# Patient Record
Sex: Male | Born: 1950 | Hispanic: Yes | Smoking: Never smoker
Health system: Southern US, Community
[De-identification: ages and names within clinical notes are randomized; demographics above are authoritative.]

## PROBLEM LIST (undated history)

## (undated) DIAGNOSIS — I10 Essential (primary) hypertension: Secondary | ICD-10-CM

## (undated) DIAGNOSIS — D649 Anemia, unspecified: Secondary | ICD-10-CM

## (undated) DIAGNOSIS — N400 Enlarged prostate without lower urinary tract symptoms: Secondary | ICD-10-CM

## (undated) HISTORY — DX: Essential (primary) hypertension: I10

## (undated) HISTORY — PX: OTHER SURGICAL HISTORY: SHX169

## (undated) HISTORY — DX: Anemia, unspecified: D64.9

---

## 2011-05-08 ENCOUNTER — Emergency Department (HOSPITAL_COMMUNITY)
Admission: EM | Admit: 2011-05-08 | Discharge: 2011-05-09 | Disposition: A | Discharge status: Home / Self Care | Payer: Self-pay | Attending: Emergency Medicine | Admitting: Emergency Medicine

## 2011-05-08 DIAGNOSIS — R339 Retention of urine, unspecified: Secondary | ICD-10-CM | POA: Insufficient documentation

## 2013-06-26 ENCOUNTER — Ambulatory Visit: Payer: Self-pay

## 2013-06-26 ENCOUNTER — Ambulatory Visit: Payer: Self-pay | Admitting: Family Medicine

## 2013-06-26 VITALS — BP 148/90 | HR 78 | Temp 98.7°F | Resp 16 | Ht 67.5 in | Wt 164.4 lb

## 2013-06-26 DIAGNOSIS — M549 Dorsalgia, unspecified: Secondary | ICD-10-CM

## 2013-06-26 DIAGNOSIS — R319 Hematuria, unspecified: Secondary | ICD-10-CM

## 2013-06-26 DIAGNOSIS — K5900 Constipation, unspecified: Secondary | ICD-10-CM

## 2013-06-26 DIAGNOSIS — R1013 Epigastric pain: Secondary | ICD-10-CM

## 2013-06-26 LAB — POCT URINALYSIS DIPSTICK
Bilirubin, UA: NEGATIVE
Glucose, UA: NEGATIVE
Leukocytes, UA: NEGATIVE
Nitrite, UA: NEGATIVE
Protein, UA: 100
Spec Grav, UA: >=1.03
Urobilinogen, UA: 0.2
pH, UA: 5.5

## 2013-06-26 LAB — POCT CBC
Granulocyte percent: 89.3 %G — AB (ref 37–80)
HCT, POC: 47.5 % (ref 43.5–53.7)
Hemoglobin: 15.3 g/dL (ref 14.1–18.1)
Lymph, poc: 0.9 (ref 0.6–3.4)
MCH, POC: 32.3 pg — AB (ref 27–31.2)
MCHC: 32.2 g/dL (ref 31.8–35.4)
MCV: 100.5 fL — AB (ref 80–97)
MID (cbc): 0.4 (ref 0–0.9)
MPV: 8.5 fL (ref 0–99.8)
POC Granulocyte: 10.4 — AB (ref 2–6.9)
POC LYMPH PERCENT: 7.6 % — AB (ref 10–50)
POC MID %: 3.1 %M (ref 0–12)
Platelet Count, POC: 274 10*3/uL (ref 142–424)
RBC: 4.73 M/uL (ref 4.69–6.13)
RDW, POC: 14.1 %
WBC: 11.7 10*3/uL — AB (ref 4.6–10.2)

## 2013-06-26 LAB — COMPREHENSIVE METABOLIC PANEL
ALT: 36 U/L (ref 0–53)
AST: 21 U/L (ref 0–37)
Albumin: 4.8 g/dL (ref 3.5–5.2)
Calcium: 9.9 mg/dL (ref 8.4–10.5)
Chloride: 103 mEq/L (ref 96–112)
Potassium: 4.1 mEq/L (ref 3.5–5.3)
Total Bilirubin: 0.3 mg/dL (ref 0.3–1.2)
Total Protein: 7.8 g/dL (ref 6.0–8.3)

## 2013-06-26 LAB — POCT UA - MICROSCOPIC ONLY
Bacteria, U Microscopic: NEGATIVE
Casts, Ur, LPF, POC: NEGATIVE
Crystals, Ur, HPF, POC: NEGATIVE
Epithelial cells, urine per micros: NEGATIVE
Mucus, UA: NEGATIVE
RBC, urine, microscopic: NEGATIVE
WBC, Ur, HPF, POC: NEGATIVE
Yeast, UA: NEGATIVE

## 2013-06-26 LAB — COMPREHENSIVE METABOLIC PANEL WITH GFR
Alkaline Phosphatase: 71 U/L (ref 39–117)
BUN: 11 mg/dL (ref 6–23)
CO2: 25 meq/L (ref 19–32)
Creat: 0.72 mg/dL (ref 0.50–1.35)
Glucose, Bld: 160 mg/dL — ABNORMAL HIGH (ref 70–99)
Sodium: 138 meq/L (ref 135–145)

## 2013-06-26 MED ORDER — SENNOSIDES-DOCUSATE SODIUM 8.6-50 MG PO TABS: 2.0000 | ORAL_TABLET | Freq: Every day | ORAL | Status: DC

## 2013-06-26 MED ORDER — PROMETHAZINE HCL 12.5 MG PO TABS: 12.5000 mg | ORAL_TABLET | Freq: Three times a day (TID) | ORAL | Status: DC | PRN

## 2013-06-26 MED ORDER — TAMSULOSIN HCL 0.4 MG PO CAPS: 0.4000 mg | ORAL_CAPSULE | Freq: Every day | ORAL | Status: DC

## 2013-06-26 MED ORDER — NAPROXEN 500 MG PO TABS: 500.0000 mg | ORAL_TABLET | Freq: Two times a day (BID) | ORAL | Status: DC

## 2013-06-26 NOTE — Patient Instructions (Signed)
Dieta B.R.A.T. (B.R.A.T. Diet) Su mdico Reatha Sur ha recomendado la dieta B.R.A.T para usted o su hijo hasta que su enfermedad mejore. Se utiliza comnmente para ayudar a Chief Operating Officer los sntomas de diarrea y vmitos. Si usted o su hijo pueden tolerar el consumo de lquidos claros, tambin pueden consumir:  Bananas.   Arroz.   Compota de [place].   Marvell Fuller (y otros almidones simples como galletas, patatas, y fideos).  Asegrese de [employer] productos lcteos, carnes, y alimentos grasosos hasta que los sntomas mejoren. Los jugos de fruta como el de [place], uvas, o ciruela, pueden [employer]. Evtelos. Contine esta dieta por 2 das o segn las indicaciones del profesional que lo asiste. Document Released: 09/20/2005 Document Revised: 09/09/2011 [facility] Patient Information 2012 [place], [place].Dolor abdominal (Abdominal Pain) El dolor de estmago (abdominal) puede deberse a mltiples causas. No suelen ser peligrosos. La intensidad del dolor no tienen que ver con la gravedad del problema. Muchos de estos casos de dolor abdominal pueden controlarse y tratarse en casa. CUIDADOS EN EL HOGAR  No tome ni administre medicamentos como laxantes, excepto que el mdico se lo indique. Este medicamento hace que una persona evace el intestino.  Use analgsicos y [employer], segn los que Mikylah Ackroyd ha prescripto el mdico.  Coma o beba lo que Timm Bonenberger indique el mdico. El mdico Kirrah Mustin dir si debe seguir una dieta especial. BUSQUE AYUDA DE INMEDIATO SI:  El dolor persiste.  Tiene fiebre.  Usted sigue vomitando.  El dolor cambia y se localiza slo en la zona derecha o izquierda del abdomen.  La materia fecal es sangre o parece alquitrn. ASEGRESE DE QUE:   Comprende esas instrucciones para el alta mdica.  Controlar su enfermedad.  Pedir ayuda de inmediato si no mejora o empeora. Document Released: 12/17/2008 Document Revised: 12/13/2011 [facility] Patient Information 2014 [place],  [place].

## 2013-06-26 NOTE — Progress Notes (Signed)
[facility]:  Office Visit  Chief Complaint:  Chief Complaint  Patient presents with  . Nausea    Started last night  . Emesis  . Abdominal Pain    HPI: Thomas Beard is a  62 y.o. Hispanic  male who complains of ate last night around 9-10 pm, then had abd pain at 3 am, has had nausea, but no vomiting Ate fried chicken last night,  Feels like he has a lot of gas in in stomach , Moderate crampy pain, no one else is sick with similar sxs. Has not had similar sxs like this before. No fevers, chills, minimal pain with movement.  He still has appendix and also GB. No prior abd surgeires.  He thinks he has reflux.  He had BM yesterday morning, normal BM. No straining, no constipation No blood in urine or stool.  He denies having a colonoscopy. Denies kidney stones   History reviewed. No pertinent past medical history. History reviewed. No pertinent past surgical history. History   Social History  . Marital Status: Unknown    Spouse Name: N/A    Number of Children: N/A  . Years of Education: N/A   Social History Main Topics  . Smoking status: Never Smoker   . Smokeless tobacco: None  . Alcohol Use: No  . Drug Use: No  . Sexual Activity: None   Other Topics Concern  . None   Social History Narrative  . None   History reviewed. No pertinent family history. No Known Allergies Prior to Admission medications   Not on File     ROS: The patient denies fevers, chills, night sweats, unintentional weight loss, chest pain, palpitations, wheezing, dyspnea on exertion,  dysuria, hematuria, melena, numbness, weakness, or tingling. + nausea, no vomiting  All other systems have been reviewed and were otherwise negative with the exception of those mentioned in the HPI and as above.    PHYSICAL EXAM: Filed Vitals:   06/26/13 0813  BP: 148/90  Pulse: 78  Temp: 98.7 F (37.1 C)  Resp: 16   Filed Vitals:   06/26/13 0813  Height: 5' 7.5" (1.715 m)  Weight:  164 lb 6.4 oz (74.571 kg)   Body mass index is 25.35 kg/(m^2).  General: Alert, minimal distress HEENT:  Normocephalic, atraumatic, oropharynx patent. EOMI, PERRLA Cardiovascular:  Regular rate and rhythm, no rubs murmurs or gallops.  No Carotid bruits, radial pulse intact. No pedal edema.  Respiratory: Clear to auscultation bilaterally.  No wheezes, rales, or rhonchi.  No cyanosis, no use of accessory musculature GI: No organomegaly, abdomen is soft and bilateral upper midepigastric tenderness, positive bowel sounds.   No guarding. No masses. + CVA tenderness  Skin: No rashes. Neurologic: Facial musculature symmetric. Psychiatric: Patient is appropriate throughout our interaction. Lymphatic: No cervical lymphadenopathy Musculoskeletal: Gait intact.   LABS: Results for orders placed in visit on 06/26/13  POCT CBC      Result Value Range   WBC 11.7 (*) 4.6 - 10.2 K/uL   Lymph, poc 0.9  0.6 - 3.4   POC LYMPH PERCENT 7.6 (*) 10 - 50 %L   MID (cbc) 0.4  0 - 0.9   POC MID % 3.1  0 - 12 %M   POC Granulocyte 10.4 (*) 2 - 6.9   Granulocyte percent 89.3 (*) 37 - 80 %G   RBC 4.73  4.69 - 6.13 M/uL   Hemoglobin 15.3  14.1 - 18.1 g/dL   HCT, POC 47.5  43.5 - 53.7 %   MCV 100.5 (*) 80 - 97 fL   MCH, POC 32.3 (*) 27 - 31.2 pg   MCHC 32.2  31.8 - 35.4 g/dL   RDW, POC 47.8     Platelet Count, POC 274  142 - 424 K/uL   MPV 8.5  0 - 99.8 fL  POCT UA - MICROSCOPIC ONLY      Result Value Range   WBC, Ur, HPF, POC neg     RBC, urine, microscopic neg     Bacteria, U Microscopic neg     Mucus, UA neg     Epithelial cells, urine per micros neg     Crystals, Ur, HPF, POC neg     Casts, Ur, LPF, POC neg     Yeast, UA neg    POCT URINALYSIS DIPSTICK      Result Value Range   Color, UA yellow     Clarity, UA clear     Glucose, UA neg     Bilirubin, UA neg     Ketones, UA trace     Spec Grav, UA >=1.030     Blood, UA trace     pH, UA 5.5     Protein, UA 100     Urobilinogen, UA 0.2      Nitrite, UA neg     Leukocytes, UA Negative       EKG/XRAY:   Primary read interpreted by Dr. Conley Rolls at [facility]. Abnormal gas pattern with area of narrowing, please comment No free air Please comment if you see any xray e/o hydro or stones   ASSESSMENT/PLAN: Encounter Diagnoses  Name Primary?  . Abdominal pain, epigastric Yes  . Back pain   . Constipation   . Hematuria    ? Etiology: Biliary vs Renal Stones vs Appendicitis vs obstruction.  Mild leukocytosis and also hematuria and also abnormal gas pattern on abd xray may just be related to constipaton with early signs of obstruction Rx Naproxen, Phenergan, Flomax and strainer. No narcotics at this time.  Push liquids, no foods for now except maybe bland diet gave samples of miralax and also  Advise to try otc senokot S Gave patient precautions to go to Er for worsening sxs,  reiterated patient precautions to go to ER vis translator They deferred RUQ abdominal US at this time.  F/u prn Gross sideeffects, risk and benefits, and alternatives of medications d/w patient. Patient is aware that all medications have potential sideeffects and we are unable to predict every sideeffect or drug-drug interaction that may occur.  Romario Tith PHUONG, DO 06/26/2013 10:09 AM

## 2013-06-28 ENCOUNTER — Telehealth: Payer: Self-pay | Admitting: Family Medicine

## 2013-06-28 NOTE — Telephone Encounter (Signed)
Spoke to patient about xrays and also lab results, he is doing much better. Advise to go to ER prn

## 2014-05-06 DIAGNOSIS — R339 Retention of urine, unspecified: Secondary | ICD-10-CM | POA: Insufficient documentation

## 2014-05-07 ENCOUNTER — Encounter (HOSPITAL_COMMUNITY): Payer: Self-pay | Admitting: Emergency Medicine

## 2014-05-07 ENCOUNTER — Emergency Department (HOSPITAL_COMMUNITY)
Admission: EM | Admit: 2014-05-07 | Discharge: 2014-05-07 | Disposition: A | Discharge status: Home / Self Care | Payer: Self-pay | Attending: Emergency Medicine | Admitting: Emergency Medicine

## 2014-05-07 DIAGNOSIS — R339 Retention of urine, unspecified: Secondary | ICD-10-CM

## 2014-05-07 LAB — URINALYSIS, ROUTINE W REFLEX MICROSCOPIC
BILIRUBIN URINE: NEGATIVE
GLUCOSE, UA: NEGATIVE mg/dL
KETONES UR: NEGATIVE mg/dL
Leukocytes, UA: NEGATIVE
Nitrite: NEGATIVE
PROTEIN: 30 mg/dL — AB
Specific Gravity, Urine: 1.009 (ref 1.005–1.030)
Urobilinogen, UA: 0.2 mg/dL (ref 0.0–1.0)
pH: 5.5 (ref 5.0–8.0)

## 2014-05-07 LAB — URINE MICROSCOPIC-ADD ON

## 2014-05-07 MED ORDER — TAMSULOSIN HCL 0.4 MG PO CAPS: 0.4000 mg | ORAL_CAPSULE | Freq: Every day | ORAL | Status: DC

## 2014-05-07 NOTE — Discharge Instructions (Signed)
Retención urinaria aguda °(Acute Urinary Retention) °La retención urinaria aguda es la incapacidad transitoria para orinar. °Es un problema muy frecuente en los hombre de edad avanzada. A medida que el hombre envejece, la próstata se agranda y bloquea el flujo de orina que proviene de la vejiga. Generalmente es un problema que surge gradualmente.  °INSTRUCCIONES PARA EL CUIDADO EN EL HOGAR °Si van a enviarlo a su casa con un catéter Foley y un sistema de drenaje, necesitará resolver cuál será el mejor curso de acción junto con su médico. Mientras el catéter esté colocado, mantenga una buena ingesta de líquidos. Mantenga la bolsa de drenaje vacía y en posición más baja que el catéter. De este modo, la orina contaminada no fluirá hacia atrás, hacia la vejiga, lo que podría causar una infección urinaria. °Hay dos tipos principales de bolsa de drenaje. Una es una bolsa grande que generalmente se utiliza por la noche. Tiene una buena capacidad, lo que permitirá dormir toda la noche sin tener que vaciarla. El segundo tipo se llama bolsa de pierna. Tiene menos capacidad por lo tanto necesita vaciarse con más frecuencia. Sin embargo, la ventaja principal es que puede adherirse con una correa a la pierna y puede ir debajo de la ropa, permitiendo la libertad para moverse o dejar su casa. °Utilice los medicamentos de venta libre o recetados para calmar el dolor, el malestar o la fiebre, según se lo indique el médico.  °SOLICITE ATENCIÓN MÉDICA SI: °· Tiene fiebre baja. °· Siente espasmos o pérdidas de orina con los espasmos. °SOLICITE ATENCIÓN MÉDICA DE INMEDIATO SI:  °· Siente escalofríos o le sube la fiebre. °· El catéter deja de drenar orina. °· El catéter se sale del lugar. °· Aumenta el sangrado y no cesa con reposo ni al aumentar la ingesta de líquidos. °ASEGÚRESE DE QUE: °· Comprende estas instrucciones. °· Controlará su afección. °· Recibirá ayuda de inmediato si no mejora o si empeora. °Document Released: 06/30/2005  Document Revised: 09/25/2013 °ExitCare® Patient Information ©2015 [employer]. This information is not intended to replace advice given to you by your health care provider. Make sure you discuss any questions you have with your health care provider. ° °

## 2014-05-07 NOTE — ED Notes (Signed)
Pt. reports urinary retention / bladder pressure onset this evening .

## 2014-05-07 NOTE — ED Provider Notes (Signed)
CSN: [id number]     Arrival date & time 05/06/14  2353 History   First MD Initiated Contact with Patient 05/07/14 0020     Chief Complaint  Patient presents with  . Urinary Retention     (Consider location/radiation/quality/duration/timing/severity/associated sxs/prior Treatment) HPI Patient presents with acute urinary retention starting this evening. Describes bladder pressure and inability to urinate. No fever or chills. No blood in his urine. No flank pain. Patient has had previous episodes similar to this 2 years ago. Was diagnosed with urinary retention due to BPH. Start on Flomax at that time. Currently not taking Flomax. For catheter placed in triage with improvement of patient's bladder pressure. He is currently asymptomatic. History reviewed. No pertinent past medical history. History reviewed. No pertinent past surgical history. No family history on file. History  Substance Use Topics  . Smoking status: Never Smoker   . Smokeless tobacco: Not on file  . Alcohol Use: No    Review of Systems  Constitutional: Negative for fever and chills.  Eyes: Negative for visual disturbance.  Respiratory: Negative for cough, chest tightness, shortness of breath and wheezing.   Cardiovascular: Negative for chest pain and palpitations.  Gastrointestinal: Negative for nausea, vomiting and abdominal pain.  Genitourinary: Positive for difficulty urinating. Negative for frequency, hematuria and flank pain.  Musculoskeletal: Negative for back pain, neck pain and neck stiffness.  Skin: Negative for rash and wound.  Neurological: Negative for dizziness, weakness, light-headedness, numbness and headaches.  All other systems reviewed and are negative.     Allergies  Review of patient's allergies indicates no known allergies.  Home Medications   Prior to Admission medications   Not on File   BP 157/101  Pulse 85  Temp(Src) 97.3 F (36.3 C) (Oral)  Resp 14  Wt 166 lb (75.297 kg)  SpO2  97% Physical Exam  Nursing note and vitals reviewed. Constitutional: He is oriented to person, place, and time. He appears well-developed and well-nourished. No distress.  HENT:  Head: Normocephalic and atraumatic.  Mouth/Throat: Oropharynx is clear and moist.  Eyes: EOM are normal. Pupils are equal, round, and reactive to light.  Neck: Normal range of motion. Neck supple.  Cardiovascular: Normal rate and regular rhythm.   Pulmonary/Chest: Effort normal and breath sounds normal. No respiratory distress. He has no wheezes. He has no rales.  Abdominal: Soft. Bowel sounds are normal. He exhibits no distension and no mass. There is no tenderness. There is no rebound and no guarding.  Musculoskeletal: Normal range of motion. He exhibits no edema and no tenderness.  No CVA tenderness  Neurological: He is alert and oriented to person, place, and time.  Skin: Skin is warm and dry. No rash noted. No erythema.  Psychiatric: He has a normal mood and affect. His behavior is normal.    ED Course  Procedures (including critical care time) Labs Review Labs Reviewed  URINALYSIS, ROUTINE W REFLEX MICROSCOPIC - Abnormal; Notable for the following:    APPearance CLOUDY (*)    Hgb urine dipstick LARGE (*)    Protein, ur 30 (*)    All other components within normal limits  URINE MICROSCOPIC-ADD ON - Abnormal; Notable for the following:    Bacteria, UA FEW (*)    All other components within normal limits    Imaging Review No results found.   EKG Interpretation None      MDM   Final diagnoses:  None    Patient urinary retention resolved refill catheter. He is advised  to followup with his urologist. Maryclare LabradorWe'll start back on Flomax. Return precautions given.    Loren Raceravid Reilley Valentine, MD 05/07/14 [phone]

## 2014-05-07 NOTE — ED Notes (Signed)
Drainage bag changed on pt.  Leg bag placed.

## 2014-05-24 ENCOUNTER — Encounter (HOSPITAL_COMMUNITY): Payer: Self-pay | Admitting: Emergency Medicine

## 2014-05-24 ENCOUNTER — Emergency Department (HOSPITAL_COMMUNITY)
Admission: EM | Admit: 2014-05-24 | Discharge: 2014-05-24 | Disposition: A | Discharge status: Home / Self Care | Payer: Self-pay | Attending: Emergency Medicine | Admitting: Emergency Medicine

## 2014-05-24 DIAGNOSIS — R319 Hematuria, unspecified: Secondary | ICD-10-CM | POA: Insufficient documentation

## 2014-05-24 DIAGNOSIS — N3000 Acute cystitis without hematuria: Secondary | ICD-10-CM | POA: Insufficient documentation

## 2014-05-24 DIAGNOSIS — N390 Urinary tract infection, site not specified: Secondary | ICD-10-CM

## 2014-05-24 DIAGNOSIS — N3001 Acute cystitis with hematuria: Secondary | ICD-10-CM

## 2014-05-24 DIAGNOSIS — R509 Fever, unspecified: Secondary | ICD-10-CM

## 2014-05-24 DIAGNOSIS — Z79899 Other long term (current) drug therapy: Secondary | ICD-10-CM | POA: Insufficient documentation

## 2014-05-24 LAB — BASIC METABOLIC PANEL
Anion gap: 18 — ABNORMAL HIGH (ref 5–15)
BUN: 10 mg/dL (ref 6–23)
CHLORIDE: 99 meq/L (ref 96–112)
CO2: 19 meq/L (ref 19–32)
Calcium: 8.9 mg/dL (ref 8.4–10.5)
Creatinine, Ser: 0.76 mg/dL (ref 0.50–1.35)
GFR calc Af Amer: >90 mL/min (ref >90)
GFR calc non Af Amer: >90 mL/min (ref >90)
GLUCOSE: 166 mg/dL — AB (ref 70–99)
POTASSIUM: 3.6 meq/L — AB (ref 3.7–5.3)
SODIUM: 136 meq/L — AB (ref 137–147)

## 2014-05-24 LAB — I-STAT CHEM 8, ED
BUN: 9 mg/dL (ref 6–23)
Calcium, Ion: 1.01 mmol/L — ABNORMAL LOW (ref 1.13–1.30)
Chloride: 105 mEq/L (ref 96–112)
Creatinine, Ser: 0.7 mg/dL (ref 0.50–1.35)
Glucose, Bld: 181 mg/dL — ABNORMAL HIGH (ref 70–99)
HCT: 42 % (ref 39.0–52.0)
Hemoglobin: 14.3 g/dL (ref 13.0–17.0)
Potassium: 3.4 mEq/L — ABNORMAL LOW (ref 3.7–5.3)
Sodium: 134 mEq/L — ABNORMAL LOW (ref 137–147)
TCO2: 19 mmol/L (ref 0–100)

## 2014-05-24 LAB — CBC WITH DIFFERENTIAL/PLATELET
Basophils Absolute: 0 10*3/uL (ref 0.0–0.1)
Basophils Relative: 0 % (ref 0–1)
EOS PCT: 0 % (ref 0–5)
Eosinophils Absolute: 0 10*3/uL (ref 0.0–0.7)
HCT: 39 % (ref 39.0–52.0)
Hemoglobin: 13.5 g/dL (ref 13.0–17.0)
LYMPHS ABS: 0.3 10*3/uL — AB (ref 0.7–4.0)
LYMPHS PCT: 4 % — AB (ref 12–46)
MCH: 32 pg (ref 26.0–34.0)
MCHC: 34.6 g/dL (ref 30.0–36.0)
MCV: 92.4 fL (ref 78.0–100.0)
Monocytes Absolute: 0.6 10*3/uL (ref 0.1–1.0)
Monocytes Relative: 7 % (ref 3–12)
NEUTROS PCT: 89 % — AB (ref 43–77)
Neutro Abs: 6.9 10*3/uL (ref 1.7–7.7)
PLATELETS: 260 10*3/uL (ref 150–400)
RBC: 4.22 MIL/uL (ref 4.22–5.81)
RDW: 14.2 % (ref 11.5–15.5)
WBC: 7.7 10*3/uL (ref 4.0–10.5)

## 2014-05-24 LAB — URINALYSIS, ROUTINE W REFLEX MICROSCOPIC
Glucose, UA: NEGATIVE mg/dL
Ketones, ur: 40 mg/dL — AB
Nitrite: POSITIVE — AB
PH: 6 (ref 5.0–8.0)
Specific Gravity, Urine: 1.03 (ref 1.005–1.030)
Urobilinogen, UA: 1 mg/dL (ref 0.0–1.0)

## 2014-05-24 LAB — URINE MICROSCOPIC-ADD ON

## 2014-05-24 MED ORDER — DEXTROSE 5 % IV SOLN
1.0000 g | Freq: Once | INTRAVENOUS | Status: AC
  Administered 2014-05-24: 1 g via INTRAVENOUS

## 2014-05-24 MED ORDER — SODIUM CHLORIDE 0.9 % IV BOLUS (SEPSIS)
1000.0000 mL | Freq: Once | INTRAVENOUS | Status: AC
  Administered 2014-05-24: 1000 mL via INTRAVENOUS

## 2014-05-24 MED ORDER — CEPHALEXIN 500 MG PO CAPS: 500.0000 mg | ORAL_CAPSULE | Freq: Three times a day (TID) | ORAL | Status: DC

## 2014-05-24 MED ORDER — ACETAMINOPHEN 325 MG PO TABS
650.0000 mg | ORAL_TABLET | Freq: Four times a day (QID) | ORAL | Status: DC | PRN
  Administered 2014-05-24: 650 mg via ORAL
  Filled 2014-05-24: qty 2

## 2014-05-24 MED ORDER — CIPROFLOXACIN HCL 500 MG PO TABS: 500.0000 mg | ORAL_TABLET | Freq: Two times a day (BID) | ORAL | Status: DC

## 2014-05-24 MED ORDER — DEXTROSE 5 % IV SOLN
1.0000 g | Freq: Once | INTRAVENOUS | Status: DC
  Filled 2014-05-24: qty 10

## 2014-05-24 NOTE — ED Notes (Signed)
Pt brought to [facility] 11 per triage nurse. Bladder scan done.

## 2014-05-24 NOTE — ED Notes (Signed)
Pt reports catheter position is better since reposition; no blood noted around catheter at this time

## 2014-05-24 NOTE — ED Provider Notes (Signed)
CSN: [id number]     Arrival date & time 05/24/14  1310 History  This chart was scribed for non-physician practitioner Arthor CaptainAbigail Rozalyn Osland, PA-C working with Samuel JesterKathleen McManus, DO by Leone PayorSonum Patel, ED Scribe. This patient was seen in room TR11C/TR11C and the patient's care was started at 1:40 PM.    Chief Complaint  Patient presents with  . Hematuria    The history is provided by the patient. No language interpreter was used.    HPI Comments: Thomas Beard is a 63 y.o. male who presents to the Emergency Department complaining of urinary retention with associated suprapubic pain that began this morning. Patient states his last urine void was about 5.5 hours ago. Patient was initially seen on 05/06/14 for urinary retention, which was relieved after he was catheterized. Patient was then seen by urologist yesterday when the catheter was removed. He was able to urinate normally yesterday as well as early this morning. Patient reports associated hematuria that began about 3 days ago. He reports associated episodes of intermittent chills and fever for the same period of time. He denies nausea, vomiting.    History reviewed. No pertinent past medical history. History reviewed. No pertinent past surgical history. History reviewed. No pertinent family history. History  Substance Use Topics  . Smoking status: Never Smoker   . Smokeless tobacco: Not on file  . Alcohol Use: No    Review of Systems  Constitutional: Positive for fever and chills.  Gastrointestinal: Negative for nausea and vomiting.  Genitourinary: Positive for hematuria and difficulty urinating.       Urinary retention   All other systems reviewed and are negative.     Allergies  Review of patient's allergies indicates no known allergies.  Home Medications   Prior to Admission medications   Medication Sig Start Date End Date Taking? Authorizing Provider  tamsulosin (FLOMAX) 0.4 MG CAPS capsule Take 1 capsule (0.4 mg total) by mouth  daily. 05/07/14   Loren Raceravid Yelverton, MD   BP 126/79  Pulse 145  Temp(Src) 98.6 F (37 C) (Oral)  Resp 24  Ht 5\' 7"  (1.702 m)  Wt 160 lb (72.576 kg)  BMI 25.05 kg/m2  SpO2 95% Physical Exam  Nursing note and vitals reviewed. Constitutional: He is oriented to person, place, and time. He appears well-developed and well-nourished.  Uncomfortable appearing   HENT:  Head: Normocephalic and atraumatic.  Cardiovascular: Normal rate.   Pulmonary/Chest: Effort normal.  Abdominal: He exhibits no distension.  Genitourinary: Circumcised. Discharge (blood urethral meatus ) found.  Neurological: He is alert and oriented to person, place, and time.  Skin: Skin is warm and dry.  Psychiatric: He has a normal mood and affect.    ED Course  BLADDER CATHETERIZATION Date/Time: 05/28/2014 12:10 PM Performed by: Arthor CaptainHARRIS, Jameal Razzano Authorized by: Arthor CaptainHARRIS, Nellie Pester Consent: Verbal consent obtained. Risks and benefits: risks, benefits and alternatives were discussed Consent given by: patient Patient identity confirmed: verbally with patient Time out: Immediately prior to procedure a "time out" was called to verify the correct patient, procedure, equipment, support staff and site/side marked as required. Indications: hematuria and urinary retention Local anesthesia used: yes Local anesthetic: topical anesthetic Anesthetic total: 2 ml Patient sedated: no Preparation: Patient was prepped and draped in the usual sterile fashion. Catheter insertion: temporary indwelling Catheter type: Foley Catheter size: 18 Fr Complicated insertion: no Altered anatomy: no Bladder irrigation: no Number of attempts: 1 Urine volume: 900 ml Urine characteristics: bloody Patient tolerance: Patient tolerated the procedure well with no immediate complications.   (  including critical care time)  DIAGNOSTIC STUDIES: Oxygen Saturation is 95% on RA, adequate by my interpretation.    COORDINATION OF CARE: 1:47 PM Will  catheterize patient. Discussed treatment plan with pt at bedside and pt agreed to plan.   Labs Review Labs Reviewed  URINALYSIS, ROUTINE W REFLEX MICROSCOPIC - Abnormal; Notable for the following:    Color, Urine RED (*)    APPearance TURBID (*)    Hgb urine dipstick LARGE (*)    Bilirubin Urine LARGE (*)    Ketones, ur 40 (*)    Protein, ur >300 (*)    Nitrite POSITIVE (*)    Leukocytes, UA MODERATE (*)    All other components within normal limits  CBC WITH DIFFERENTIAL - Abnormal; Notable for the following:    Neutrophils Relative % 89 (*)    Lymphocytes Relative 4 (*)    Lymphs Abs 0.3 (*)    All other components within normal limits  BASIC METABOLIC PANEL - Abnormal; Notable for the following:    Sodium 136 (*)    Potassium 3.6 (*)    Glucose, Bld 166 (*)    Anion gap 18 (*)    All other components within normal limits  I-STAT CHEM 8, ED - Abnormal; Notable for the following:    Sodium 134 (*)    Potassium 3.4 (*)    Glucose, Bld 181 (*)    Calcium, Ion 1.01 (*)    All other components within normal limits  URINE MICROSCOPIC-ADD ON    Imaging Review No results found.   EKG Interpretation None      MDM   Final diagnoses:  Acute hemorrhagic cystitis  UTI (lower urinary tract infection)  Fever and chills    Patient with acute urinary retention. Insertion of a new Foley catheter achieved with greater than 900 mL of grossly bloody urine. Patient felt significant relief after retention relieved. Awaiting urinalysis.   Patient given a fluid bolus, Tylenol for new onset fever during his visit. UA does appear to be positive for UTI. No white blood cell count elevation no elevation in creatinine. Patient's receiving 1 g Rocephin IV. The please consult to on call urologist.   I spoke with Dr. Annabell Howells was on call for [facility]. The patient's finger has resolved and his tachycardia is decreased after treatment of his fever. Do not feel the patient has sepsis or urosepsis.  He has no CVA angle tenderness. Patient will be discharged with Cipro per recommendation of Dr. Annabell Howells Patient / Family / Caregiver informed of clinical course, understand medical decision-making process, and agree with plan. I've obtained a followup appointment for the patient.  I personally reviewed the imaging tests through PACS system. I have reviewed and interpreted Lab values. I reviewed available ER/hospitalization records through the EMR    I personally performed the services described in this documentation, which was scribed in my presence. The recorded information has been reviewed and is accurate.    Arthor Captain, PA-C 05/28/14 1221

## 2014-05-24 NOTE — ED Notes (Signed)
Per pt family pt had catheter taken out yesterday and unable to void this am and only blood. Pt tachy at triage HR 145. Pt in severe pain

## 2014-05-24 NOTE — Discharge Instructions (Signed)
Infeccin urinaria  (Urinary Tract Infection)  La infeccin urinaria puede ocurrir en cualquier lugar del tracto urinario. El tracto urinario es un sistema de drenaje del cuerpo por el que se eliminan los desechos y el exceso de agua. El tracto urinario est formado por dos riones, dos urteres, la vejiga y la uretra. Los riones son rganos que tienen forma de frijol. Cada rin tiene aproximadamente el tamao del puo. Estn situados debajo de las costillas, uno a cada lado de la columna vertebral CAUSAS  La causa de la infeccin son los microbios, que son organismos microscpicos, que incluyen hongos, virus, y bacterias. Estos organismos son tan pequeos que slo pueden verse a travs del microscopio. Las bacterias son los microorganismos que ms comnmente causan infecciones urinarias.  SNTOMAS  Los sntomas pueden variar segn la edad y el sexo del paciente y por la ubicacin de la infeccin. Los sntomas en las mujeres jvenes incluyen la necesidad frecuente e intensa de orinar y una sensacin dolorosa de ardor en la vejiga o en la uretra durante la miccin. Las mujeres y los hombres mayores podrn sentir cansancio, temblores y debilidad y sentir dolores musculares y dolor abdominal. Si tiene fiebre, puede significar que la infeccin est en los riones. Otros sntomas son dolor en la espalda o en los lados debajo de las costillas, nuseas y vmitos.  DIAGNSTICO  Para diagnosticar una infeccin urinaria, el mdico le preguntar acerca de sus sntomas. Tambin le solicitar una muestra de orina. La muestra de orina se analiza para detectar bacterias y glbulos blancos de la sangre. Los glbulos blancos se forman en el organismo para ayudar a combatir las infecciones.  TRATAMIENTO  Por lo general, las infecciones urinarias pueden tratarse con medicamentos. Debido a que la mayora de las infecciones son causadas por bacterias, por lo general pueden tratarse con antibiticos. La eleccin del  antibitico y la duracin del tratamiento depender de sus sntomas y el tipo de bacteria causante de la infeccin.  INSTRUCCIONES PARA EL CUIDADO EN EL HOGAR   Si le recetaron antibiticos, tmelos exactamente como su mdico le indique. Termine el medicamento aunque se sienta mejor despus de haber tomado slo algunos.  Beba gran cantidad de lquido para mantener la orina de tono claro o color amarillo plido.  Evite la cafena, el t y las bebidas gaseosas. Estas sustancias irritan la vejiga.  Vaciar la vejiga con frecuencia. Evite retener la orina durante largos perodos.  Vace la vejiga antes y despus de tener relaciones sexuales.  Despus de mover el intestino, las mujeres deben higienizarse la regin perineal desde adelante hacia atrs. Use slo un papel tissue por vez. SOLICITE ATENCIN MDICA SI:   Siente dolor en la espalda.  Le sube la fiebre.  Los sntomas no mejoran luego de 3 das. SOLICITE ATENCIN MDICA DE INMEDIATO SI:   Siente dolor intenso en la espalda o en la zona inferior del abdomen.  Comienza a sentir escalofros.  Tiene nuseas o vmitos.  Tiene una sensacin continua de quemazn o molestias al orinar. ASEGRESE DE QUE:   Comprende estas instrucciones.  Controlar su enfermedad.  Solicitar ayuda de inmediato si no mejora o empeora. Document Released: 06/30/2005 Document Revised: 06/14/2012 ExitCare Patient Information 2015 ExitCare, LLC. This information is not intended to replace advice given to you by your health care provider. Make sure you discuss any questions you have with your health care provider.   Ciprofloxacin tablets Qu es este medicamento? La CIPROFLOXACINA es un antibitico quinolnico. Se utiliza en el   tratamiento de ciertos tipos de infecciones bacterianas. No es efectivo para resfros, gripe u otras infecciones de origen viral. Este medicamento puede ser utilizado para otros usos; si tiene alguna pregunta consulte con su  proveedor de atencin mdica o con su farmacutico. MARCAS COMERCIALES DISPONIBLES: Cipro Qu le debo informar a mi profesional de la salud antes de tomar este medicamento? Necesita saber si usted presenta alguno de los siguientes problemas o situaciones: -problemas de huesos -enfermedad cerebral -problemas de articulaciones -pulso cardaco irregular -enfermedad renal -enfermedad heptica -miastenia gravis -trastorno de convulsiones -problemas de tendones -una reaccin alrgica o inusual a la ciprofloxacina, a otros antibiticos, medicamentos, alimentos, colorantes o conservantes -si est embarazada o buscando quedar embarazada -si est amamantando a un beb Cmo debo utilizar este medicamento? Tome este medicamento por va oral con un vaso de agua. Siga las instrucciones de la etiqueta del medicamento. Tome sus dosis a intervalos regulares. No tome su medicamento con una frecuencia mayor a la indicada. Tome todas las dosis de su medicamento como se le haya indicado aun si se siente mejor. No omita ninguna dosis o suspenda el uso de su medicamento antes de lo indicado. Este medicamento se puede tomar con las comidas o con el estmago vaco. No lo debe tomar con productos lcteos, tales como leche, yogur o jugos fortificados con calcio solos, sin embargo lo puede tomar con comidas que contienen productos lcteos. Su farmacutico le dar una Gua del medicamento especial con cada receta y relleno. Asegrese de leer esta informacin cada vez cuidadosamente. Hable con su pediatra para informarse acerca del uso de este medicamento en nios. Puede requerir atencin especial. Sobredosis: Pngase en contacto inmediatamente con un centro toxicolgico o una sala de urgencia si usted cree que haya tomado demasiado medicamento. ATENCIN: Este medicamento es solo para usted. No comparta este medicamento con nadie. Qu sucede si me olvido de una dosis? Si olvida una dosis, tmela lo antes posible. Si es  casi la hora de la prxima dosis, tome slo esa dosis. No tome dosis adicionales o dobles. Qu puede interactuar con este medicamento? No tome esta medicina con ninguno de los siguientes medicamentos: -cisapride -droperidol -terfenadina -tizanidina Esta medicina tambin puede interactuar con los siguientes medicamentos -anticidos -pldoras anticonceptivas -cafena -ciclosporina -polvo o tabletas tamponadas de didanosina (ddI) -medicamentos para la diabetes -medicamentos para la inflamacin, tales como ibuprofeno, naproxeno -metotrexato -multivitaminas -omeprazol -fenitona -probenecid -sucralfato -teofilina -warfarina Puede ser que esta lista no menciona todas las posibles interacciones. Informe a su profesional de la salud de todos los productos a base de hierbas, medicamentos de venta libre o suplementos nutritivos que est tomando. Si usted fuma, consume bebidas alcohlicas o si utiliza drogas ilegales, indqueselo tambin a su profesional de la salud. Algunas sustancias pueden interactuar con su medicamento. A qu debo estar atento al usar este medicamento? Si los sntomas no mejoran, consulte con su mdico o con su profesional de la salud. No trate la diarrea con productos de venta libre. Comunquese con su mdico si tiene diarrea que dura ms de 2 das o si es severa y acuosa. Puede experimentar somnolencia o mareos. No conduzca ni utilice maquinaria, ni haga nada que le exija permanecer en estado de alerta hasta que sepa cmo le afecta este medicamento. No se siente ni se ponga de pie con rapidez, especialmente si es un paciente de edad avanzada. Esto reduce el riesgo de mareos o desmayos. Este medicamento puede aumentar la sensibilidad al sol. Mantngase fuera de la luz solar. Si no lo puede   evitar, utilice ropa protectora y crema de proteccin solar. No utilice lmparas solares, camas solares ni cabinas solares. Evite las preparaciones de anticidos, aluminio, calcio, hierro,  magnesio o cinc por lo menos 6 horas antes o 2 horas despus de tomar este medicamento. Qu efectos secundarios puedo tener al utilizar este medicamento? Efectos secundarios que debe informar a su mdico o a su profesional de la salud tan pronto como sea posible: -reacciones alrgicas como erupcin cutnea, picazn o urticarias, hinchazn de la cara, labios o lengua -problemas respiratorios -confusin, pesadillas o alucinaciones -sensacin de desmayos o aturdimiento, cadas -pulso cardiaco irregular -hinchazn o dolores musculares, articulares o de tendones -dolor o dificultad para orinar -dolor de cabeza persistente con o sin visin borrosa -enrojecimiento, formacin de ampollas, descamacin o aflojamiento de la piel, inclusive dentro de la boca -convulsiones -dolor, entumecimiento, hormigueo o debilidad inusual Efectos secundarios que, por lo general, no requieren atencin mdica (debe informarlos a su mdico o a su profesional de la salud si persisten o si son molestos): -diarrea -nuseas o malestar estomacal -manchas blancas o llagas dentro de la boca Puede ser que esta lista no menciona todos los posibles efectos secundarios. Comunquese a su mdico por asesoramiento mdico sobre los efectos secundarios. Usted puede informar los efectos secundarios a la FDA por telfono al 1-800-FDA-1088. Dnde debo guardar mi medicina? Mantngala fuera del alcance de los nios. Gurdela a temperatura ambiente, a menos de 30 grados C (86 grados F). Mantenga el envase bien cerrado. Deseche todo el medicamento que no haya utilizado, despus de la fecha de vencimiento. ATENCIN: Este folleto es un resumen. Puede ser que no cubra toda la posible informacin. Si usted tiene preguntas acerca de esta medicina, consulte con su mdico, su farmacutico o su profesional de la salud.  2015, Elsevier[employer]. (2013-07-23 16:49:31)  

## 2014-05-24 NOTE — ED Notes (Addendum)
Pt urinating dark red urine around catheter. Reports "every time I have to go, it comes out around the catheter." Catheter repositioned and taped. Linens changed by NT. Pt denies pain

## 2014-05-28 NOTE — ED Provider Notes (Signed)
Medical screening examination/treatment/procedure(s) were performed by non-physician practitioner and as supervising physician I was immediately available for consultation/collaboration.   EKG Interpretation None        Maria Gallicchio, DO 05/28/14 1741 

## 2014-07-16 ENCOUNTER — Emergency Department (HOSPITAL_COMMUNITY)
Admission: EM | Admit: 2014-07-16 | Discharge: 2014-07-16 | Disposition: A | Discharge status: Home / Self Care | Payer: Worker's Compensation | Attending: Emergency Medicine | Admitting: Emergency Medicine

## 2014-07-16 ENCOUNTER — Emergency Department (HOSPITAL_COMMUNITY): Payer: Worker's Compensation

## 2014-07-16 ENCOUNTER — Encounter (HOSPITAL_COMMUNITY): Payer: Self-pay | Admitting: Emergency Medicine

## 2014-07-16 DIAGNOSIS — Y288XXA Contact with other sharp object, undetermined intent, initial encounter: Secondary | ICD-10-CM | POA: Insufficient documentation

## 2014-07-16 DIAGNOSIS — Z79899 Other long term (current) drug therapy: Secondary | ICD-10-CM | POA: Insufficient documentation

## 2014-07-16 DIAGNOSIS — Y9289 Other specified places as the place of occurrence of the external cause: Secondary | ICD-10-CM | POA: Insufficient documentation

## 2014-07-16 DIAGNOSIS — Y9389 Activity, other specified: Secondary | ICD-10-CM | POA: Diagnosis not present

## 2014-07-16 DIAGNOSIS — W208XXA Other cause of strike by thrown, projected or falling object, initial encounter: Secondary | ICD-10-CM | POA: Diagnosis not present

## 2014-07-16 DIAGNOSIS — N400 Enlarged prostate without lower urinary tract symptoms: Secondary | ICD-10-CM | POA: Insufficient documentation

## 2014-07-16 DIAGNOSIS — S51812A Laceration without foreign body of left forearm, initial encounter: Secondary | ICD-10-CM | POA: Diagnosis present

## 2014-07-16 DIAGNOSIS — Z23 Encounter for immunization: Secondary | ICD-10-CM | POA: Diagnosis not present

## 2014-07-16 HISTORY — DX: Benign prostatic hyperplasia without lower urinary tract symptoms: N40.0

## 2014-07-16 MED ORDER — CEPHALEXIN 500 MG PO CAPS: 500.0000 mg | ORAL_CAPSULE | Freq: Four times a day (QID) | ORAL | Status: DC

## 2014-07-16 MED ORDER — LIDOCAINE HCL 2 % IJ SOLN
5.0000 mL | Freq: Once | INTRAMUSCULAR | Status: AC
  Administered 2014-07-16: 100 mg via INTRADERMAL
  Filled 2014-07-16: qty 20

## 2014-07-16 MED ORDER — TETANUS-DIPHTH-ACELL PERTUSSIS 5-2.5-18.5 LF-MCG/0.5 IM SUSP
0.5000 mL | Freq: Once | INTRAMUSCULAR | Status: AC
  Administered 2014-07-16: 0.5 mL via INTRAMUSCULAR
  Filled 2014-07-16: qty 0.5

## 2014-07-16 MED ORDER — CEPHALEXIN 250 MG PO CAPS: 500.0000 mg | ORAL_CAPSULE | Freq: Once | ORAL | Status: DC

## 2014-07-16 MED ORDER — IBUPROFEN 800 MG PO TABS: 800.0000 mg | ORAL_TABLET | Freq: Three times a day (TID) | ORAL | Status: DC

## 2014-07-16 NOTE — ED Notes (Signed)
Per EMS, patient dropped some porcelain plates and got cut.   Patient has small laceration to L arm.  Bleeding controlled at this time.

## 2014-07-16 NOTE — ED Provider Notes (Signed)
CSN: [id number]     Arrival date & time 07/16/14  1302 History   First MD Initiated Contact with Patient 07/16/14 1306     Chief Complaint  Patient presents with  . Extremity Laceration     (Consider location/radiation/quality/duration/timing/severity/associated sxs/prior Treatment) Patient is a 63 y.o. male presenting with skin laceration. The history is provided by the patient. No language interpreter was used.  Laceration Location:  Shoulder/arm Shoulder/arm laceration location:  L forearm Laceration mechanism:  Broken glass (Forearm laceration caused by broken ceramic bowl that fell from rack while working at [employer] Tetanus status:  Out of date   Past Medical History  Diagnosis Date  . Enlarged prostate    History reviewed. No pertinent past surgical history. No family history on file. History  Substance Use Topics  . Smoking status: Never Smoker   . Smokeless tobacco: Not on file  . Alcohol Use: No    Review of Systems  Constitutional: Negative for fever and chills.  Musculoskeletal: Negative.   Skin: Positive for wound.       Laceration.  Neurological: Negative.  Negative for numbness.      Allergies  Review of patient's allergies indicates no known allergies.  Home Medications   Prior to Admission medications   Medication Sig Start Date End Date Taking? Authorizing Provider  finasteride (PROSCAR) 5 MG tablet Take 5 mg by mouth daily.   Yes Historical Provider, MD  tamsulosin (FLOMAX) 0.4 MG CAPS capsule Take 0.4 mg by mouth daily. 05/07/14  Yes Loren Raceravid Yelverton, MD   BP 126/68  Pulse 69  Temp(Src) 98 F (36.7 C) (Oral)  Resp 18  SpO2 98% Physical Exam  Constitutional: He is oriented to person, place, and time. He appears well-developed and well-nourished. No distress.  Neck: Normal range of motion.  Pulmonary/Chest: Effort normal.  Musculoskeletal: Normal range of motion.  FROM left upper extremity without weakness on resistance.    Neurological: He is alert and oriented to person, place, and time.  Skin: Skin is warm and dry.  4 cm full thickness laceration to volar and medial left forearm. No tendon exposure.  Psychiatric: He has a normal mood and affect.    ED Course  Procedures (including critical care time) Labs Review Labs Reviewed - No data to display  Imaging Review No results found.   EKG Interpretation None     LACERATION REPAIR Performed by: Elpidio AnisUPSTILL, Evalina Tabak A Authorized by: Elpidio AnisUPSTILL, Precious Segall A Consent: Verbal consent obtained. Risks and benefits: risks, benefits and alternatives were discussed Consent given by: patient Patient identity confirmed: provided demographic data Prepped and Draped in normal sterile fashion Wound explored  Laceration Location: left forearm  Laceration Length: 4cm  No Foreign Bodies seen or palpated  Anesthesia: local infiltration  Local anesthetic: lidocaine 2% w/o epinephrine  Anesthetic total: 2 ml  Irrigation method: syringe Amount of cleaning: aggressive Wound explored - no tendon or muscle rupture visualized. FROM and strength testing performed during wound exploration.  Skin closure: staples  Number of sutures: 6  Technique: staples  Patient tolerance: Patient tolerated the procedure well with no immediate complications.  MDM   Final diagnoses:  None    1. Left forearm laceration  Will cover with abx. Negative imaging for foreign body. Instructions on discharge given with interpreter to insure understanding.    Arnoldo HookerShari A Kobee Medlen, PA-C 07/17/14 [id number]

## 2014-07-16 NOTE — Discharge Instructions (Signed)
Extraccin de grapas Agricultural consultant(Staple Care and Removal) En el da de hoy, el profesional que lo ha asistido ha utilizado grapas para reparar una herida. Las grapas se utilizan para que una herida pueda curarse ms rpidamente al Freescale Semiconductorsostener los mrgenes (bordes) de la herida unidos. Las grapas se retiran cuando la herida ha curado lo suficientemente bien para que los bordes permanezcan juntos si [place]. Podrn colocarle un vendaje (cubrir la herida), segn la localizacin de la herida. ste podr cambiarse una vez por da o segn se le indique. Si el vendaje se adhiere, podr remojarse con [place] jabonosa o perxido de hidrgeno [place] oxigenada).  Utilice los medicamentos de venta libre o de prescripcin para Chief Technology Officerel dolor, Environmental health practitionerel malestar o la [place]bre, segn se lo indique el profesional que lo asiste. Si hoy no recibi la vacuna contra el ttanos porque no recuerda cundo fue su ltima vacunacin, asegrese de consultarle al mdico, el da Wal-Martque le retiren las grapas, si es necesario que se vacune. Regrese al [facility] del profesional para que le retire las grapas dentro de 1 semana, o cuando se lo indique. SOLICITE ATENCIN MDICA DE INMEDIATO SI:  Presenta enrojecimiento, hinchazn o aumento del dolor en la herida.  Aparece pus en la herida.  Tiene fiebre.  Advierte un olor ftido que proviene de la herida o del vendaje.  La herida se abre (los bordes no se mantienen juntos) luego de Furniture conservator/restorerla extraccin de las grapas. Document Released: 06/30/2005 Document Revised: 12/13/2011 [facility] Patient Information 2015 [place], [place]. This information is not intended to replace advice given to you by your health care provider. Make sure you discuss any questions you have with your health care provider.

## 2014-07-17 NOTE — ED Provider Notes (Signed)
Medical screening examination/treatment/procedure(s) were performed by non-physician practitioner and as supervising physician I was immediately available for consultation/collaboration.   EKG Interpretation None        Kourtland Coopman M Vivian Okelley, MD 07/17/14 1941 

## 2014-07-26 ENCOUNTER — Emergency Department (HOSPITAL_COMMUNITY)
Admission: EM | Admit: 2014-07-26 | Discharge: 2014-07-26 | Disposition: A | Discharge status: Home / Self Care | Payer: Worker's Compensation | Attending: Emergency Medicine | Admitting: Emergency Medicine

## 2014-07-26 ENCOUNTER — Encounter (HOSPITAL_COMMUNITY): Payer: Self-pay | Admitting: Emergency Medicine

## 2014-07-26 DIAGNOSIS — Z791 Long term (current) use of non-steroidal anti-inflammatories (NSAID): Secondary | ICD-10-CM | POA: Insufficient documentation

## 2014-07-26 DIAGNOSIS — Z79899 Other long term (current) drug therapy: Secondary | ICD-10-CM | POA: Diagnosis not present

## 2014-07-26 DIAGNOSIS — Z4802 Encounter for removal of sutures: Secondary | ICD-10-CM | POA: Diagnosis not present

## 2014-07-26 DIAGNOSIS — N400 Enlarged prostate without lower urinary tract symptoms: Secondary | ICD-10-CM | POA: Diagnosis not present

## 2014-07-26 DIAGNOSIS — Z792 Long term (current) use of antibiotics: Secondary | ICD-10-CM | POA: Diagnosis not present

## 2014-07-26 NOTE — ED Provider Notes (Signed)
CSN: [id number]     Arrival date & time 07/26/14  1549 History   First MD Initiated Contact with Patient 07/26/14 1554     Chief Complaint  Patient presents with  . Suture / Staple Removal     (Consider location/radiation/quality/duration/timing/severity/associated sxs/prior Treatment) HPI Comments: Patient presents to the emergency department with chief complaint of request for staple removal. He was seen here 11 days ago and had 6 staples placed in his left arm after cutting it on a ceramic dish. He states that he does not have any pain. He denies any fevers, chills, redness, or discharge. There no aggravating or alleviating factors. No complaints at this time.  The history is provided by the patient. No language interpreter was used.    Past Medical History  Diagnosis Date  . Enlarged prostate    History reviewed. No pertinent past surgical history. History reviewed. No pertinent family history. History  Substance Use Topics  . Smoking status: Never Smoker   . Smokeless tobacco: Not on file  . Alcohol Use: No    Review of Systems  Constitutional: Negative for fever and chills.  Respiratory: Negative for shortness of breath.   Cardiovascular: Negative for chest pain.  Gastrointestinal: Negative for nausea, vomiting, diarrhea and constipation.  Genitourinary: Negative for dysuria.  Skin: Positive for wound.      Allergies  Review of patient's allergies indicates no known allergies.  Home Medications   Prior to Admission medications   Medication Sig Start Date End Date Taking? Authorizing Provider  cephALEXin (KEFLEX) 500 MG capsule Take 1 capsule (500 mg total) by mouth 4 (four) times daily. 07/16/14   Shari A Upstill, PA-C  finasteride (PROSCAR) 5 MG tablet Take 5 mg by mouth daily.    Historical Provider, MD  ibuprofen (ADVIL,MOTRIN) 800 MG tablet Take 1 tablet (800 mg total) by mouth 3 (three) times daily. 07/16/14   Shari A Upstill, PA-C  tamsulosin (FLOMAX) 0.4  MG CAPS capsule Take 0.4 mg by mouth daily. 05/07/14   Loren Raceravid Yelverton, MD   BP 131/71  Pulse 69  Temp(Src) 98.2 F (36.8 C) (Oral)  Resp 18  Wt 155 lb 6 oz (70.478 kg)  SpO2 96% Physical Exam  Nursing note and vitals reviewed. Constitutional: He is oriented to person, place, and time. He appears well-developed and well-nourished.  HENT:  Head: Normocephalic and atraumatic.  Eyes: Conjunctivae and EOM are normal.  Neck: Normal range of motion.  Cardiovascular: Normal rate.   Pulmonary/Chest: Effort normal.  Abdominal: He exhibits no distension.  Musculoskeletal: Normal range of motion.  Range of motion and strength of left upper extremity is 5/5, no bony tenderness, deformity, or abnormality  Neurological: He is alert and oriented to person, place, and time.  Skin: Skin is dry.  Well-healing laceration to left anterior arm, there is a small area of wound dehiscence approximately 0.5 cm in the center of the laceration, no discharge or drainage, no surrounding cellulitis, no evidence of abscess, staples in place  Psychiatric: He has a normal mood and affect. His behavior is normal. Judgment and thought content normal.    ED Course  Procedures (including critical care time) Labs Review Labs Reviewed - No data to display  Imaging Review No results found.   EKG Interpretation None     SUTURE REMOVAL Performed by: Roxy HorsemanBROWNING, Myana Schlup  Consent: Verbal consent obtained. Consent given by: patient Required items: required blood products, implants, devices, and special equipment available Time out: Immediately prior to procedure a "  time out" was called to verify the correct patient, procedure, equipment, support staff and site/side marked as required.  Location: Left elbow  Wound Appearance: clean  Sutures/Staples Removed: 6   Patient tolerance: Patient tolerated the procedure well with no immediate complications.    MDM   Final diagnoses:  Encounter for staple removal     Patient here for staple removal. The wound appears to be healing well apart from a small area of dehiscence as described above. Staples were removed without complication. I applied Steri-Strips to the wound near the area of dehiscence and also apply bacitracin and covered the area. This should heal fine on its own. Recommend primary care followup. Patient is stable and ready for discharge. Return precautions given.    Roxy Horsemanobert Berda Shelvin, PA-C 07/26/14 [phone]

## 2014-07-26 NOTE — ED Notes (Signed)
PA at bedside removing staples at this time.

## 2014-07-26 NOTE — Discharge Instructions (Signed)
Remoción de la sutura, cuidados posteriores °(Suture Removal, Care After) °Siga estas instrucciones durante las próximas semanas. Estas indicaciones le proporcionan información general acerca de cómo deberá cuidarse después del procedimiento. El médico también podrá darle instrucciones más específicas. El tratamiento se ha planificado de acuerdo a las prácticas médicas actuales, pero a veces se producen problemas. Comuníquese con el médico si tiene algún problema o tiene dudas después del procedimiento. °QUÉ ESPERAR DESPUÉS DEL PROCEDIMIENTO °Después de que le retiren los puntos (suturas), es normal experimentar lo siguiente: °· Molestias e hinchazón en la zona de la herida. °· Leve enrojecimiento en la zona de la herida. °INSTRUCCIONES PARA EL CUIDADO EN EL HOGAR  °· Si tiene bandas adhesivas en la piel sobre la zona de la herida, no las retire. Se caerán solas en unos pocos días. Si las bandas adhesivas siguen en su lugar después de 14 días, entonces puede retirarlas. °· Cambie los apósitos (vendajes), al menos, una vez al día o según las instrucciones del médico. Si el vendaje se adhiere, remójelo con agua jabonosa tibia. °· Solo aplique un ungüento o crema según las indicaciones del médico. Si usa crema o ungüento, lave la zona con agua y jabón dos veces por día para quitarlo todo. Enjuague el jabón y seque suavemente la zona con una toalla limpia. °· Mantenga la herida limpia y seca. Si el vendaje se moja, se ensucia o tiene mal olor, cámbielo tan pronto como pueda. °· Continúe protegiendo la herida de lesiones. °· Use protector solar cuando salga al sol. Las cicatrices nuevas se broncean fácilmente. °SOLICITE ATENCIÓN MÉDICA SI: °· Aumenta el enrojecimiento, la hinchazón o el dolor en la zona afectada. °· Observa que sale pus de la herida. °· Tiene fiebre. °· Advierte un olor fétido que proviene de la herida o del vendaje. °· La herida se abre (los bordes se separan). °Document Released: 06/30/2005 Document  Revised: 07/11/2013 °ExitCare® Patient Information ©2015 ExitCare, LLC. This information is not intended to replace advice given to you by your health care provider. Make sure you discuss any questions you have with your health care provider. ° °

## 2014-07-26 NOTE — ED Notes (Signed)
Pt in to get staples removed from left elbow, placed 10 days ago

## 2014-07-30 NOTE — ED Provider Notes (Signed)
Medical screening examination/treatment/procedure(s) were performed by non-physician practitioner and as supervising physician I was immediately available for consultation/collaboration.   EKG Interpretation None        Matthew Gentry, MD 07/30/14 0121 

## 2019-01-24 ENCOUNTER — Emergency Department (HOSPITAL_COMMUNITY)
Admission: EM | Admit: 2019-01-24 | Discharge: 2019-01-25 | Disposition: A | Discharge status: Home / Self Care | Payer: Self-pay | Attending: Emergency Medicine | Admitting: Emergency Medicine

## 2019-01-24 ENCOUNTER — Other Ambulatory Visit: Payer: Self-pay

## 2019-01-24 ENCOUNTER — Encounter (HOSPITAL_COMMUNITY): Payer: Self-pay | Admitting: Emergency Medicine

## 2019-01-24 DIAGNOSIS — N3000 Acute cystitis without hematuria: Secondary | ICD-10-CM

## 2019-01-24 DIAGNOSIS — R339 Retention of urine, unspecified: Secondary | ICD-10-CM

## 2019-01-24 LAB — URINALYSIS, ROUTINE W REFLEX MICROSCOPIC
Bilirubin Urine: NEGATIVE
Glucose, UA: NEGATIVE mg/dL
Ketones, ur: NEGATIVE mg/dL
Nitrite: POSITIVE — AB
Protein, ur: NEGATIVE mg/dL
Specific Gravity, Urine: 1.008 (ref 1.005–1.030)
pH: 6 (ref 5.0–8.0)

## 2019-01-24 NOTE — ED Triage Notes (Signed)
C/o unable to urinate x 1 1/2 hours.  History of enlarged prostate.

## 2019-01-24 NOTE — ED Notes (Signed)
  Bladder scan performed at bedside and shows residual of >859ml.  MD notified.

## 2019-01-25 MED ORDER — TAMSULOSIN HCL 0.4 MG PO CAPS
0.4000 mg | ORAL_CAPSULE | Freq: Once | ORAL | Status: AC
  Administered 2019-01-25: 01:00:00 0.4 mg via ORAL
  Filled 2019-01-25: qty 1

## 2019-01-25 MED ORDER — FINASTERIDE 5 MG PO TABS: 5.0000 mg | ORAL_TABLET | Freq: Every day | ORAL | 1 refills | Status: DC

## 2019-01-25 MED ORDER — CEPHALEXIN 250 MG PO CAPS
1000.0000 mg | ORAL_CAPSULE | Freq: Once | ORAL | Status: AC
  Administered 2019-01-25: 01:00:00 1000 mg via ORAL
  Filled 2019-01-25: qty 4

## 2019-01-25 MED ORDER — CEPHALEXIN 500 MG PO CAPS: 500.0000 mg | ORAL_CAPSULE | Freq: Four times a day (QID) | ORAL | 0 refills | Status: DC

## 2019-01-25 NOTE — ED Provider Notes (Signed)
Emergency Department Provider Note   I have reviewed the triage vital signs and the nursing notes.   HISTORY  Chief Complaint Urinary Retention   HPI Thomas Beard is a 68 y.o. male with a history of BPH who occasionally self catheterizes for retention who presents with decreased UOP for the last couple days and suprapubic pain. No new back pain, paresthesias, weakness or recent infections. Tried catheterizing himself and it hurt too much.    No other associated or modifying symptoms.    Past Medical History:  Diagnosis Date  . Enlarged prostate     There are no active problems to display for this patient.   History reviewed. No pertinent surgical history.  Current Outpatient Rx  . Order #: [id number] Class: Print  . Order #: [id number] Class: Print  . Order #: [id number] Class: Print    Allergies Patient has no known allergies.  No family history on file.  Social History Social History   Tobacco Use  . Smoking status: Never Smoker  Substance Use Topics  . Alcohol use: No  . Drug use: No    Review of Systems  All other systems negative except as documented in the HPI. All pertinent positives and negatives as reviewed in the HPI. ____________________________________________   PHYSICAL EXAM:  VITAL SIGNS: ED Triage Vitals  Enc Vitals Group     BP 01/24/19 2259 (!) 152/97     Pulse Rate 01/24/19 2259 93     Resp 01/24/19 2259 18     Temp 01/24/19 2259 98 F (36.7 C)     Temp Source 01/24/19 2259 Oral     SpO2 01/24/19 2259 95 %    Constitutional: Alert and oriented. Well appearing and in no acute distress. Eyes: Conjunctivae are normal. PERRL. EOMI. Head: Atraumatic. Nose: No congestion/rhinnorhea. Mouth/Throat: Mucous membranes are moist.  Oropharynx non-erythematous. Neck: No stridor.  No meningeal signs.   Cardiovascular: tachycardic rate, regular rhythm. Good peripheral circulation. Grossly normal heart sounds.   Respiratory: Normal  respiratory effort.  No retractions. Lungs CTAB. Gastrointestinal: Soft and nontender. No distention.  Musculoskeletal: No lower extremity tenderness nor edema. No gross deformities of extremities. Neurologic:  Normal speech and language. No gross focal neurologic deficits are appreciated.  Skin:  Skin is warm, dry and intact. No rash noted.  ____________________________________________   LABS (all labs ordered are listed, but only abnormal results are displayed)  Labs Reviewed  URINALYSIS, ROUTINE W REFLEX MICROSCOPIC - Abnormal; Notable for the following components:      Result Value   APPearance HAZY (*)    Hgb urine dipstick MODERATE (*)    Nitrite POSITIVE (*)    Leukocytes,Ua MODERATE (*)    Bacteria, UA MANY (*)    All other components within normal limits  URINE CULTURE   ____________________________________________  INITIAL IMPRESSION / ASSESSMENT AND PLAN / ED COURSE  Slightly HTN and Tachy on arrival, likely related to pain as when catheter placed and urine drained these improved. Found to have likely UTI. Started abx. Restarted alpha reductase inhibitor and will fu at urology for recheck on Monday.   Pertinent labs & imaging results that were available during my care of the patient were reviewed by me and considered in my medical decision making (see chart for details).  A medical screening exam was performed and I feel the patient has had an appropriate workup for their chief complaint at this time and likelihood of emergent condition existing is low. They have been counseled on decision, discharge,  follow up and which symptoms necessitate immediate return to the emergency department. They or their family verbally stated understanding and agreement with plan and discharged in stable condition.   ____________________________________________  FINAL CLINICAL IMPRESSION(S) / ED DIAGNOSES  Final diagnoses:  Urinary retention  Acute cystitis without hematuria      MEDICATIONS GIVEN DURING THIS VISIT:  Medications  cephALEXin (KEFLEX) capsule 1,000 mg (has no administration in time range)  tamsulosin (FLOMAX) capsule 0.4 mg (has no administration in time range)     NEW OUTPATIENT MEDICATIONS STARTED DURING THIS VISIT:  New Prescriptions   CEPHALEXIN (KEFLEX) 500 MG CAPSULE    Take 1 capsule (500 mg total) by mouth 4 (four) times daily.    Note:  This note was prepared with assistance of Dragon voice recognition software. Occasional wrong-word or sound-a-like substitutions may have occurred due to the inherent limitations of voice recognition software.   Kavonte Bearse, Barbara Cower, MD 01/25/19 [phone]

## 2019-01-25 NOTE — ED Notes (Signed)
  Foley catheter converted to leg drainage bag and patient being discharged home with follow up consult.

## 2019-01-27 LAB — URINE CULTURE: Culture: >=100000 — AB

## 2019-01-28 ENCOUNTER — Telehealth: Payer: Self-pay | Admitting: Emergency Medicine

## 2019-01-28 NOTE — Telephone Encounter (Signed)
Post ED Visit - Positive Culture Follow-up: Successful Patient Follow-Up  Culture assessed and recommendations reviewed by:  []  Enzo Bi, Pharm.D. []  Celedonio Miyamoto, Pharm.D., BCPS AQ-ID []  Garvin Fila, Pharm.D., BCPS []  Georgina Pillion, Pharm.D., BCPS []  Charlo, [place].D., BCPS, AAHIVP []  Estella Husk, Pharm.D., BCPS, AAHIVP []  Lysle Pearl, PharmD, BCPS []  Phillips Climes, PharmD, BCPS []  Agapito Games, PharmD, BCPS [x]  Ewing Schlein, PharmD  Positive urine culture  []  Patient discharged without antimicrobial prescription and treatment is now indicated [x]  Organism is resistant to prescribed ED discharge antimicrobial []  Patient with positive blood cultures  Changes discussed with ED provider: Elizabeth Sauer, PA New antibiotic prescription Bactrim 160/800 mg PO BID x seven days Called to [place] [employer]) [phone]  Contacted patient, date 01/28/2019, time 1415   Norm Parcel 01/28/2019, 2:32 PM

## 2019-01-30 ENCOUNTER — Other Ambulatory Visit: Payer: Self-pay

## 2019-01-30 ENCOUNTER — Emergency Department (HOSPITAL_COMMUNITY)
Admission: EM | Admit: 2019-01-30 | Discharge: 2019-01-30 | Disposition: A | Discharge status: Home / Self Care | Payer: Self-pay | Attending: Emergency Medicine | Admitting: Emergency Medicine

## 2019-01-30 DIAGNOSIS — R339 Retention of urine, unspecified: Secondary | ICD-10-CM | POA: Insufficient documentation

## 2019-01-30 DIAGNOSIS — Z79899 Other long term (current) drug therapy: Secondary | ICD-10-CM | POA: Insufficient documentation

## 2019-01-30 LAB — URINALYSIS, ROUTINE W REFLEX MICROSCOPIC
Bacteria, UA: NONE SEEN
Bilirubin Urine: NEGATIVE
Glucose, UA: NEGATIVE mg/dL
Ketones, ur: NEGATIVE mg/dL
Nitrite: NEGATIVE
Protein, ur: NEGATIVE mg/dL
RBC / HPF: >50 RBC/hpf — ABNORMAL HIGH (ref 0–5)
Specific Gravity, Urine: 1.008 (ref 1.005–1.030)
pH: 6 (ref 5.0–8.0)

## 2019-01-30 LAB — URINE CULTURE: Culture: NO GROWTH

## 2019-01-30 LAB — POCT I-STAT EG7
Acid-Base Excess: 1 mmol/L (ref 0.0–2.0)
Bicarbonate: 24.9 mmol/L (ref 20.0–28.0)
Calcium, Ion: 1.16 mmol/L (ref 1.15–1.40)
HCT: 41 % (ref 39.0–52.0)
Hemoglobin: 13.9 g/dL (ref 13.0–17.0)
O2 Saturation: 64 %
Potassium: 3.2 mmol/L — ABNORMAL LOW (ref 3.5–5.1)
Sodium: 141 mmol/L (ref 135–145)
TCO2: 26 mmol/L (ref 22–32)
pCO2, Ven: 35.1 mmHg — ABNORMAL LOW (ref 44.0–60.0)
pH, Ven: 7.458 — ABNORMAL HIGH (ref 7.250–7.430)
pO2, Ven: 31 mmHg — CL (ref 32.0–45.0)

## 2019-01-30 LAB — I-STAT CREATININE, ED: Creatinine, Ser: 0.6 mg/dL — ABNORMAL LOW (ref 0.61–1.24)

## 2019-01-30 LAB — CBG MONITORING, ED: Glucose-Capillary: 104 mg/dL — ABNORMAL HIGH (ref 70–99)

## 2019-01-30 MED ORDER — NITROFURANTOIN MONOHYD MACRO 100 MG PO CAPS: 100.0000 mg | ORAL_CAPSULE | Freq: Two times a day (BID) | ORAL | 0 refills | Status: DC

## 2019-01-30 MED ORDER — TAMSULOSIN HCL 0.4 MG PO CAPS: 0.4000 mg | ORAL_CAPSULE | Freq: Every day | ORAL | 0 refills | Status: DC

## 2019-01-30 MED ORDER — TAMSULOSIN HCL 0.4 MG PO CAPS
0.4000 mg | ORAL_CAPSULE | ORAL | Status: AC
  Administered 2019-01-30: 04:00:00 0.4 mg via ORAL
  Filled 2019-01-30: qty 1

## 2019-01-30 MED ORDER — NITROFURANTOIN MONOHYD MACRO 100 MG PO CAPS
100.0000 mg | ORAL_CAPSULE | Freq: Once | ORAL | Status: AC
  Administered 2019-01-30: 04:00:00 100 mg via ORAL
  Filled 2019-01-30: qty 1

## 2019-01-30 NOTE — ED Triage Notes (Addendum)
Patient c/o problem with urination. States was seen here last week for same and had to have a urinary catheter placed. States that urine was coming out good up until Sunday, but now very little.

## 2019-01-30 NOTE — ED Notes (Signed)
Leg bag placed on pt

## 2019-01-30 NOTE — ED Provider Notes (Signed)
[facility] EMERGENCY DEPARTMENT Provider Note   CSN: [id number] Arrival date & time: 01/30/19  0132    History   Chief Complaint Chief Complaint  Patient presents with  . Dysuria    HPI Thomas Beard is a 68 y.o. male.     The history is provided by the patient. The history is limited by a language barrier. A language interpreter was used.  Dysuria  Presenting symptoms: dysuria   Presenting symptoms: no penile discharge   Context: spontaneously   Relieved by:  Nothing Worsened by:  Nothing Ineffective treatments:  None tried Associated symptoms: urinary hesitation and urinary retention   Associated symptoms: no fever, no flank pain and no vomiting   Risk factors: no sickle cell disease   Had a foley in place until Sunday when he accidentally pulled out out and now can't urinate.    Past Medical History:  Diagnosis Date  . Enlarged prostate     There are no active problems to display for this patient.   No past surgical history on file.      Home Medications    Prior to Admission medications   Medication Sig Start Date End Date Taking? Authorizing Provider  cephALEXin (KEFLEX) 500 MG capsule Take 1 capsule (500 mg total) by mouth 4 (four) times daily. 01/25/19   Mesner, Barbara Cower, MD  finasteride (PROSCAR) 5 MG tablet Take 1 tablet (5 mg total) by mouth daily. 01/25/19   Mesner, Barbara Cower, MD  ibuprofen (ADVIL,MOTRIN) 800 MG tablet Take 1 tablet (800 mg total) by mouth 3 (three) times daily. 07/16/14   Elpidio Anis, PA-C  nitrofurantoin, macrocrystal-monohydrate, (MACROBID) 100 MG capsule Take 1 capsule (100 mg total) by mouth 2 (two) times daily. X 7 days 01/30/19   Gracemarie Skeet, MD  tamsulosin (FLOMAX) 0.4 MG CAPS capsule Take 1 capsule (0.4 mg total) by mouth daily. 01/30/19   Jacque Garrels, MD    Family History No family history on file.  Social History Social History   Tobacco Use  . Smoking status: Never Smoker  Substance Use  Topics  . Alcohol use: No  . Drug use: No     Allergies   Patient has no known allergies.   Review of Systems Review of Systems  Constitutional: Negative for fever.  HENT: Negative for sore throat.   Respiratory: Negative for cough.   Cardiovascular: Negative for chest pain.  Gastrointestinal: Negative for vomiting.  Genitourinary: Positive for difficulty urinating, dysuria and hesitancy. Negative for discharge and flank pain.  All other systems reviewed and are negative.    Physical Exam Updated Vital Signs BP 134/83   Pulse 72   Temp (!) 96 F (35.6 C) (Oral)   Resp 15   SpO2 98%   Physical Exam Vitals signs and nursing note reviewed.  Constitutional:      General: He is not in acute distress.    Appearance: He is normal weight.  HENT:     Head: Normocephalic and atraumatic.     Nose: Nose normal.  Eyes:     Extraocular Movements: Extraocular movements intact.     Conjunctiva/sclera: Conjunctivae normal.  Neck:     Musculoskeletal: Normal range of motion and neck supple.  Cardiovascular:     Rate and Rhythm: Normal rate and regular rhythm.     Pulses: Normal pulses.     Heart sounds: Normal heart sounds.  Pulmonary:     Effort: Pulmonary effort is normal.     Breath sounds:  Normal breath sounds.  Abdominal:     General: Abdomen is flat. Bowel sounds are normal. There is distension.  Musculoskeletal: Normal range of motion.  Skin:    General: Skin is warm and dry.     Capillary Refill: Capillary refill takes less than 2 seconds.  Neurological:     General: No focal deficit present.     Mental Status: He is alert and oriented to person, place, and time.  Psychiatric:        Mood and Affect: Mood normal.        Behavior: Behavior normal.      ED Treatments / Results  Labs (all labs ordered are listed, but only abnormal results are displayed) Results for orders placed or performed during the hospital encounter of 01/30/19  Urinalysis, Routine w  reflex microscopic- may I&O cath if menses  Result Value Ref Range   Color, Urine YELLOW YELLOW   APPearance CLEAR CLEAR   Specific Gravity, Urine 1.008 1.005 - 1.030   pH 6.0 5.0 - 8.0   Glucose, UA NEGATIVE NEGATIVE mg/dL   Hgb urine dipstick LARGE (A) NEGATIVE   Bilirubin Urine NEGATIVE NEGATIVE   Ketones, ur NEGATIVE NEGATIVE mg/dL   Protein, ur NEGATIVE NEGATIVE mg/dL   Nitrite NEGATIVE NEGATIVE   Leukocytes,Ua TRACE (A) NEGATIVE   RBC / HPF >50 (H) 0 - 5 RBC/hpf   WBC, UA 21-50 0 - 5 WBC/hpf   Bacteria, UA NONE SEEN NONE SEEN   Mucus PRESENT   I-stat Creatinine, ED  Result Value Ref Range   Creatinine, Ser 0.60 (L) 0.61 - 1.24 mg/dL  CBG monitoring, ED  Result Value Ref Range   Glucose-Capillary 104 (H) 70 - 99 mg/dL  POCT I-Stat EG7  Result Value Ref Range   pH, Ven 7.458 (H) 7.250 - 7.430   pCO2, Ven 35.1 (L) 44.0 - 60.0 mmHg   pO2, Ven 31.0 (LL) 32.0 - 45.0 mmHg   Bicarbonate 24.9 20.0 - 28.0 mmol/L   TCO2 26 22 - 32 mmol/L   O2 Saturation 64.0 %   Acid-Base Excess 1.0 0.0 - 2.0 mmol/L   Sodium 141 135 - 145 mmol/L   Potassium 3.2 (L) 3.5 - 5.1 mmol/L   Calcium, Ion 1.16 1.15 - 1.40 mmol/L   HCT 41.0 39.0 - 52.0 %   Hemoglobin 13.9 13.0 - 17.0 g/dL   Patient temperature HIDE    Sample type VENOUS    Comment NOTIFIED PHYSICIAN    No results found.  EKG None  Radiology No results found.  Procedures Procedures (including critical care time)  Medications Ordered in ED Medications  tamsulosin (FLOMAX) capsule 0.4 mg (0.4 mg Oral Given 01/30/19 0335)  nitrofurantoin (macrocrystal-monohydrate) (MACROBID) capsule 100 mg (100 mg Oral Given 01/30/19 0335)    Foley inserted and draining well.  Will dc on flomax and antibiotics and have patient follow up with urology for voiding trial in the office.   Final Clinical Impressions(s) / ED Diagnoses   Return for intractable cough, coughing up blood,fevers >100.4 unrelieved by medication, shortness of breath,  intractable vomiting, chest pain, shortness of breath, weakness,numbness, changes in speech, facial asymmetry,abdominal pain, passing out,Inability to tolerate liquids or food, cough, altered mental status or any concerns. No signs of systemic illness or infection. The patient is nontoxic-appearing on exam and vital signs are within normal limits.   I have reviewed the triage vital signs and the nursing notes. Pertinent labs &imaging results that were available during my care of  the patient were reviewed by me and considered in my medical decision making (see chart for details).  After history, exam, and medical workup I feel the patient has been appropriately medically screened and is safe for discharge home. Pertinent diagnoses were discussed with the patient. Patient was given return precautions.  ED Discharge Orders         Ordered    nitrofurantoin, macrocrystal-monohydrate, (MACROBID) 100 MG capsule  2 times daily     01/30/19 0338    tamsulosin (FLOMAX) 0.4 MG CAPS capsule  Daily     01/30/19 0338           Tiamarie Furnari, MD 01/30/19 [id number]

## 2019-01-30 NOTE — ED Notes (Signed)
Pt reports he removed the foley catheter on Sunday and has not had any urine output since Sunday. Pt is obviously uncomfortable. Bladder scan on pt revealed greater than 910 ml

## 2021-02-07 ENCOUNTER — Emergency Department (HOSPITAL_COMMUNITY): Payer: Self-pay

## 2021-02-07 ENCOUNTER — Other Ambulatory Visit: Payer: Self-pay

## 2021-02-07 ENCOUNTER — Emergency Department (HOSPITAL_COMMUNITY)
Admission: EM | Admit: 2021-02-07 | Discharge: 2021-02-07 | Disposition: A | Discharge status: Home / Self Care | Payer: Self-pay | Attending: Emergency Medicine | Admitting: Emergency Medicine

## 2021-02-07 ENCOUNTER — Encounter (HOSPITAL_COMMUNITY): Payer: Self-pay | Admitting: Emergency Medicine

## 2021-02-07 DIAGNOSIS — N400 Enlarged prostate without lower urinary tract symptoms: Secondary | ICD-10-CM | POA: Insufficient documentation

## 2021-02-07 DIAGNOSIS — B379 Candidiasis, unspecified: Secondary | ICD-10-CM | POA: Insufficient documentation

## 2021-02-07 DIAGNOSIS — K8020 Calculus of gallbladder without cholecystitis without obstruction: Secondary | ICD-10-CM | POA: Insufficient documentation

## 2021-02-07 DIAGNOSIS — R1011 Right upper quadrant pain: Secondary | ICD-10-CM

## 2021-02-07 LAB — URINALYSIS, ROUTINE W REFLEX MICROSCOPIC
Bilirubin Urine: NEGATIVE
Glucose, UA: NEGATIVE mg/dL
Hgb urine dipstick: NEGATIVE
Ketones, ur: NEGATIVE mg/dL
Nitrite: NEGATIVE
Protein, ur: 30 mg/dL — AB
Specific Gravity, Urine: 1.011 (ref 1.005–1.030)
pH: 6 (ref 5.0–8.0)

## 2021-02-07 LAB — COMPREHENSIVE METABOLIC PANEL
ALT: 87 U/L — ABNORMAL HIGH (ref 0–44)
AST: 49 U/L — ABNORMAL HIGH (ref 15–41)
Albumin: 4.3 g/dL (ref 3.5–5.0)
Alkaline Phosphatase: 78 U/L (ref 38–126)
Anion gap: 9 (ref 5–15)
BUN: 7 mg/dL — ABNORMAL LOW (ref 8–23)
CO2: 26 mmol/L (ref 22–32)
Calcium: 9 mg/dL (ref 8.9–10.3)
Chloride: 104 mmol/L (ref 98–111)
Creatinine, Ser: 0.69 mg/dL (ref 0.61–1.24)
GFR, Estimated: >60 mL/min (ref >60)
Glucose, Bld: 129 mg/dL — ABNORMAL HIGH (ref 70–99)
Potassium: 3.8 mmol/L (ref 3.5–5.1)
Sodium: 139 mmol/L (ref 135–145)
Total Bilirubin: 0.7 mg/dL (ref 0.3–1.2)
Total Protein: 7.3 g/dL (ref 6.5–8.1)

## 2021-02-07 LAB — CBC
HCT: 51 % (ref 39.0–52.0)
Hemoglobin: 17.1 g/dL — ABNORMAL HIGH (ref 13.0–17.0)
MCH: 31.9 pg (ref 26.0–34.0)
MCHC: 33.5 g/dL (ref 30.0–36.0)
MCV: 95.1 fL (ref 80.0–100.0)
Platelets: 304 10*3/uL (ref 150–400)
RBC: 5.36 MIL/uL (ref 4.22–5.81)
RDW: 14.3 % (ref 11.5–15.5)
WBC: 6.8 10*3/uL (ref 4.0–10.5)
nRBC: 0 % (ref 0.0–0.2)

## 2021-02-07 LAB — LIPASE, BLOOD: Lipase: 49 U/L (ref 11–51)

## 2021-02-07 MED ORDER — FENTANYL CITRATE (PF) 100 MCG/2ML IJ SOLN
25.0000 ug | Freq: Once | INTRAMUSCULAR | Status: AC
  Administered 2021-02-07: 25 ug via INTRAVENOUS
  Filled 2021-02-07: qty 2

## 2021-02-07 MED ORDER — SODIUM CHLORIDE 0.9 % IV BOLUS
1000.0000 mL | Freq: Once | INTRAVENOUS | Status: AC
  Administered 2021-02-07: 1000 mL via INTRAVENOUS

## 2021-02-07 MED ORDER — FLUCONAZOLE 200 MG PO TABS: 200.0000 mg | ORAL_TABLET | Freq: Every day | ORAL | 0 refills | Status: AC

## 2021-02-07 MED ORDER — FAMOTIDINE 20 MG PO TABS: 20.0000 mg | ORAL_TABLET | Freq: Two times a day (BID) | ORAL | 0 refills | Status: DC

## 2021-02-07 MED ORDER — IOHEXOL 300 MG/ML  SOLN
100.0000 mL | Freq: Once | INTRAMUSCULAR | Status: AC | PRN
  Administered 2021-02-07: 100 mL via INTRAVENOUS

## 2021-02-07 MED ORDER — FAMOTIDINE IN NACL 20-0.9 MG/50ML-% IV SOLN
20.0000 mg | Freq: Once | INTRAVENOUS | Status: AC
  Administered 2021-02-07: 20 mg via INTRAVENOUS
  Filled 2021-02-07: qty 50

## 2021-02-07 NOTE — ED Notes (Signed)
Back from CT

## 2021-02-07 NOTE — ED Provider Notes (Signed)
[facility] EMERGENCY DEPARTMENT Provider Note   CSN: [id number] Arrival date & time: 02/07/21  0546     History Chief Complaint  Patient presents with  . Abdominal Pain    Thomas Beard is a 70 y.o. male with a past medical history of enlarged prostate presenting to the ED with a chief complaint of periumbilical abdominal pain.  Woke him up from his sleep around 2 AM.  Denies any vomiting, nausea, diarrhea, urinary symptoms other than his baseline urgency.  He has not tried medications to help with his symptoms.  No sick contacts or suspicious food intake that he is aware of.  Denies any prior abdominal surgeries.  Does report drinking 1 beer every day but denies any drug use or chronic NSAID use.  States that he had similar pain about 10 years ago but that it "went away on its own, I never found out what it was."  He denies any chest pain, shortness of breath, fever, back pain, hematochezia or melena.  Medical Spanish interpreter used throughout entirety of visit.  HPI     Past Medical History:  Diagnosis Date  . Enlarged prostate     There are no problems to display for this patient.   History reviewed. No pertinent surgical history.     No family history on file.  Social History   Tobacco Use  . Smoking status: Never Smoker  . Smokeless tobacco: Never Used  Substance Use Topics  . Alcohol use: No  . Drug use: No    Home Medications Prior to Admission medications   Medication Sig Start Date End Date Taking? Authorizing Provider  famotidine (PEPCID) 20 MG tablet Take 1 tablet (20 mg total) by mouth 2 (two) times daily. 02/07/21  Yes Makylah Bossard, PA-C  fluconazole (DIFLUCAN) 200 MG tablet Take 1 tablet (200 mg total) by mouth daily for 7 days. 02/07/21 02/14/21 Yes Emmelia Holdsworth, PA-C  cephALEXin (KEFLEX) 500 MG capsule Take 1 capsule (500 mg total) by mouth 4 (four) times daily. 01/25/19   Mesner, Barbara Cower, MD  finasteride (PROSCAR) 5 MG tablet  Take 1 tablet (5 mg total) by mouth daily. 01/25/19   Mesner, Barbara Cower, MD  ibuprofen (ADVIL,MOTRIN) 800 MG tablet Take 1 tablet (800 mg total) by mouth 3 (three) times daily. 07/16/14   Elpidio Anis, PA-C  nitrofurantoin, macrocrystal-monohydrate, (MACROBID) 100 MG capsule Take 1 capsule (100 mg total) by mouth 2 (two) times daily. X 7 days 01/30/19   Palumbo, April, MD  tamsulosin (FLOMAX) 0.4 MG CAPS capsule Take 1 capsule (0.4 mg total) by mouth daily. 01/30/19   Palumbo, April, MD    Allergies    Patient has no known allergies.  Review of Systems   Review of Systems  Constitutional: Negative for appetite change, chills and fever.  HENT: Negative for ear pain, rhinorrhea, sneezing and sore throat.   Eyes: Negative for photophobia and visual disturbance.  Respiratory: Negative for cough, chest tightness, shortness of breath and wheezing.   Cardiovascular: Negative for chest pain and palpitations.  Gastrointestinal: Positive for abdominal pain. Negative for blood in stool, constipation, diarrhea, nausea and vomiting.  Genitourinary: Negative for dysuria, hematuria and urgency.  Musculoskeletal: Negative for myalgias.  Skin: Negative for rash.  Neurological: Negative for dizziness, weakness and light-headedness.    Physical Exam Updated Vital Signs BP (!) 149/94   Pulse 65   Temp 97.6 F (36.4 C) (Oral)   Resp 16   Ht 5\' 7"  (1.702 m)  Wt 71 kg   SpO2 94%   BMI 24.52 kg/m   Physical Exam Vitals and nursing note reviewed.  Constitutional:      General: He is not in acute distress.    Appearance: He is well-developed.  HENT:     Head: Normocephalic and atraumatic.     Nose: Nose normal.  Eyes:     General: No scleral icterus.       Left eye: No discharge.     Conjunctiva/sclera: Conjunctivae normal.  Cardiovascular:     Rate and Rhythm: Normal rate and regular rhythm.     Heart sounds: Normal heart sounds. No murmur heard. No friction rub. No gallop.   Pulmonary:      Effort: Pulmonary effort is normal. No respiratory distress.     Breath sounds: Normal breath sounds.  Abdominal:     General: Bowel sounds are normal. There is no distension.     Palpations: Abdomen is soft.     Tenderness: There is abdominal tenderness in the periumbilical area. There is no guarding.  Musculoskeletal:        General: Normal range of motion.     Cervical back: Normal range of motion and neck supple.  Skin:    General: Skin is warm and dry.     Findings: No rash.  Neurological:     Mental Status: He is alert.     Motor: No abnormal muscle tone.     Coordination: Coordination normal.     ED Results / Procedures / Treatments   Labs (all labs ordered are listed, but only abnormal results are displayed) Labs Reviewed  COMPREHENSIVE METABOLIC PANEL - Abnormal; Notable for the following components:      Result Value   Glucose, Bld 129 (*)    BUN 7 (*)    AST 49 (*)    ALT 87 (*)    All other components within normal limits  CBC - Abnormal; Notable for the following components:   Hemoglobin 17.1 (*)    All other components within normal limits  URINALYSIS, ROUTINE W REFLEX MICROSCOPIC - Abnormal; Notable for the following components:   APPearance HAZY (*)    Protein, ur 30 (*)    Leukocytes,Ua MODERATE (*)    Bacteria, UA FEW (*)    All other components within normal limits  LIPASE, BLOOD    EKG None  Radiology CT ABDOMEN PELVIS W CONTRAST  Result Date: 02/07/2021 CLINICAL DATA:  Abdominal pain described as "pain in the hip of my stomach". EXAM: CT ABDOMEN AND PELVIS WITH CONTRAST TECHNIQUE: Multidetector CT imaging of the abdomen and pelvis was performed using the standard protocol following bolus administration of intravenous contrast. CONTRAST:  OMNIPAQUE IOHEXOL 300 MG/ML  SOLN COMPARISON:  None. FINDINGS: Lower chest: Dependent lung base opacities consistent with atelectasis. No acute findings at the lung bases. Hepatobiliary: Liver normal in size.  Diffuse decreased attenuation consistent with fatty infiltration. No mass. Several dependent gallstones. No gallbladder wall thickening or adjacent inflammation. No bile duct dilation. Pancreas: Unremarkable. No pancreatic ductal dilatation or surrounding inflammatory changes. Spleen: Normal in size without focal abnormality. Adrenals/Urinary Tract: No adrenal masses. Kidneys normal in size, orientation and position with symmetric enhancement and excretion. Subcentimeter low-density lesion, midpole the left kidney, consistent with a cyst. No other masses, no stones and no hydronephrosis. Normal ureters. Bladder shows small diverticula/cellule and is moderately distended. No mass or stone. Stomach/Bowel: Normal stomach. Small bowel and colon are normal in caliber. No wall  thickening. No inflammation. Appendix not visualized. No evidence of appendicitis. Vascular/Lymphatic: Minimal aortic atherosclerosis. No aneurysm. No enlarged lymph nodes. Reproductive: Marked enlargement of the prostate measuring 8.3 x 7.0 x 7.4 cm. Prostate elevates the bladder base. Other: No abdominal wall hernia or abnormality. No abdominopelvic ascites. Musculoskeletal: No fracture or acute finding.  No bone lesion. IMPRESSION: 1. No acute findings. No findings to account for the patient's pain. 2. Marked prostate hypertrophy with findings consistent with chronic bladder outlet obstruction. 3. Gallstones without evidence of acute cholecystitis. 4. Hepatic steatosis. 5. Minor aortic atherosclerosis. Electronically Signed   By: Amie Portland M.D.   On: 02/07/2021 08:36   US Abdomen Limited RUQ (LIVER/GB)  Result Date: 02/07/2021 CLINICAL DATA:  Right upper quadrant abdominal pain. Gallstones seen in the gallbladder on an abdomen and pelvis CT earlier today. EXAM: ULTRASOUND ABDOMEN LIMITED RIGHT UPPER QUADRANT COMPARISON:  Abdomen and pelvis CT obtained earlier today. FINDINGS: Gallbladder: Multiple gallstones in the gallbladder measuring up  to 1.5 cm in maximum diameter each. No gallbladder wall thickening or pericholecystic fluid. No sonographic Murphy sign. Common bile duct: Diameter: 3.5 mm Liver: Diffusely echogenic. Corresponding low density on the recent CT. Portal vein is patent on color Doppler imaging with normal direction of blood flow towards the liver. Other: None. IMPRESSION: 1. Cholelithiasis without evidence of cholecystitis. 2. Diffuse hepatic steatosis. Electronically Signed   By: Beckie Salts M.D.   On: 02/07/2021 11:36    Procedures Procedures   Medications Ordered in ED Medications  sodium chloride 0.9 % bolus 1,000 mL (0 mLs Intravenous Stopped 02/07/21 0934)  fentaNYL (SUBLIMAZE) injection 25 mcg (25 mcg Intravenous Given 02/07/21 0723)  famotidine (PEPCID) IVPB 20 mg premix (0 mg Intravenous Stopped 02/07/21 0854)  iohexol (OMNIPAQUE) 300 MG/ML solution 100 mL (100 mLs Intravenous Contrast Given 02/07/21 9604)    ED Course  I have reviewed the triage vital signs and the nursing notes.  Pertinent labs & imaging results that were available during my care of the patient were reviewed by me and considered in my medical decision making (see chart for details).  Clinical Course as of 02/07/21 1225  Sat Feb 07, 2021  0801 AST(!): 49 [HK]  0801 ALT(!): 87 [HK]  0801 Creatinine: 0.69 [HK]  0801 WBC: 6.8 [HK]  0801 Lipase: 49 [HK]  0817 Glori Luis): MODERATE [HK]  0817 Bacteria, UA(!): FEW [HK]  0817 Budding Yeast: PRESENT [HK]    Clinical Course User Index [HK] Dietrich Pates, PA-C   MDM Rules/Calculators/A&P                          70 year old male presenting to the ED with a chief complaint of periumbilical abdominal pain.  Woke him up from his sleep around 2 AM.  No associated vomiting, diarrhea, urinary symptoms, chest pain or shortness of breath.  No suspicious food intake or sick contacts with similar symptoms.  On exam there is tenderness palpation of the periumbilical area without rebound or  guarding.  Lab work including CMP, CBC and lipase unremarkable.  Will obtain urinalysis and recheck after medications.  Will need CT scan to further evaluate this abdominal pain.  CT scan without any acute findings or explanation of pain.  There is prostatic hypertrophy consistent with chronic bladder outlet obstruction as well as gallstones without evidence of cholecystitis.  However due to location and slight elevation in LFTs will obtain right upper quadrant ultrasound to evaluate ducts.  Right upper quadrant ultrasound  shows cholelithiasis without evidence of cholecystitis.  Will refer to outpatient surgery.  Patient's symptoms controlled here. Could be due to biliary colic.  Will treat symptomatically at home.  Return precautions given.   Patient is hemodynamically stable, in NAD, and able to ambulate in the ED. Evaluation does not show pathology that would require ongoing emergent intervention or inpatient treatment. I explained the diagnosis to the patient. Pain has been managed and has no complaints prior to discharge. Patient is comfortable with above plan and is stable for discharge at this time. All questions were answered prior to disposition. Strict return precautions for returning to the ED were discussed. Encouraged follow up with PCP.   An After Visit Summary was printed and given to the patient.   Portions of this note were generated with Scientist, clinical (histocompatibility and immunogenetics)Dragon dictation software. Dictation errors may occur despite best attempts at proofreading.  Final Clinical Impression(s) / ED Diagnoses Final diagnoses:  Budding yeast detected  Enlarged prostate  RUQ abdominal pain  Gallstones    Rx / DC Orders ED Discharge Orders         Ordered    fluconazole (DIFLUCAN) 200 MG tablet  Daily        02/07/21 1224    famotidine (PEPCID) 20 MG tablet  2 times daily        02/07/21 [address]1224           Lemont Sitzmann, PA-C 02/07/21 1225    Gwyneth SproutPlunkett, [place], MD 02/08/21 [phone]

## 2021-02-07 NOTE — ED Triage Notes (Signed)
Patient reports mid abdominal pain this morning , no emesis or diarrhea , denies fever or chills.

## 2021-02-07 NOTE — Discharge Instructions (Addendum)
Your CT scan did not show any abnormal findings or anything that would explain your pain. Your ultrasound shows that you have gallstones which could periodically cause pain.  You will need to follow-up with the general surgeon listed below. Your labwork did not show any abnormal findings, but you did have some yeast in your urine.  Take Diflucan to help with this. Take Tylenol as needed to help with pain.. Follow-up with your primary care provider or the 1 listed below. Return to the ER if you start to experience worsening abdominal pain, chest pain, shortness of breath, fever.   Su tomografa computarizada no mostr ningn hallazgo anormal ni nada que pudiera Chief Technology Officer. Su ecografa muestra que tiene clculos biliares que peridicamente podran Programmer, multimedia. Deber realizar un seguimiento con el cirujano general que se indica a continuacin. Su trabajo de laboratorio no mostr Scientist, forensic, pero s tena algo de levadura en la orina. Tome Diflucan para ayudar con esto. Tome Tylenol segn sea necesario para ayudar con Chief Technology Officer. Haga un seguimiento con su proveedor de atencin primaria o con el que se detalla a continuacin. Regrese a la sala de emergencias si comienza a experimentar un empeoramiento del dolor abdominal, dolor en el pecho, dificultad para respirar, fiebre.

## 2022-09-06 ENCOUNTER — Ambulatory Visit
Admission: RE | Admit: 2022-09-06 | Discharge: 2022-09-06 | Disposition: A | Discharge status: Home / Self Care | Payer: No Typology Code available for payment source | Source: Ambulatory Visit | Attending: Nurse Practitioner | Admitting: Nurse Practitioner

## 2022-09-06 ENCOUNTER — Other Ambulatory Visit: Payer: Self-pay | Admitting: Nurse Practitioner

## 2022-09-06 DIAGNOSIS — I517 Cardiomegaly: Secondary | ICD-10-CM

## 2022-09-22 ENCOUNTER — Emergency Department (HOSPITAL_COMMUNITY): Payer: Self-pay

## 2022-09-22 ENCOUNTER — Other Ambulatory Visit: Payer: Self-pay

## 2022-09-22 ENCOUNTER — Encounter (HOSPITAL_COMMUNITY): Payer: Self-pay

## 2022-09-22 ENCOUNTER — Inpatient Hospital Stay (HOSPITAL_COMMUNITY)
Admission: EM | Admit: 2022-09-22 | Discharge: 2022-09-30 | DRG: 683 | Disposition: A | Discharge status: Home / Self Care | Payer: Self-pay | Attending: Family Medicine | Admitting: Family Medicine

## 2022-09-22 DIAGNOSIS — R338 Other retention of urine: Secondary | ICD-10-CM | POA: Diagnosis present

## 2022-09-22 DIAGNOSIS — R040 Epistaxis: Secondary | ICD-10-CM | POA: Diagnosis present

## 2022-09-22 DIAGNOSIS — R911 Solitary pulmonary nodule: Secondary | ICD-10-CM | POA: Diagnosis present

## 2022-09-22 DIAGNOSIS — Z1152 Encounter for screening for COVID-19: Secondary | ICD-10-CM

## 2022-09-22 DIAGNOSIS — R339 Retention of urine, unspecified: Secondary | ICD-10-CM

## 2022-09-22 DIAGNOSIS — N138 Other obstructive and reflux uropathy: Secondary | ICD-10-CM | POA: Diagnosis present

## 2022-09-22 DIAGNOSIS — K5900 Constipation, unspecified: Secondary | ICD-10-CM | POA: Diagnosis present

## 2022-09-22 DIAGNOSIS — N320 Bladder-neck obstruction: Secondary | ICD-10-CM | POA: Diagnosis present

## 2022-09-22 DIAGNOSIS — E039 Hypothyroidism, unspecified: Secondary | ICD-10-CM | POA: Diagnosis present

## 2022-09-22 DIAGNOSIS — N1330 Unspecified hydronephrosis: Secondary | ICD-10-CM | POA: Diagnosis present

## 2022-09-22 DIAGNOSIS — I10 Essential (primary) hypertension: Secondary | ICD-10-CM | POA: Diagnosis present

## 2022-09-22 DIAGNOSIS — Z79890 Hormone replacement therapy (postmenopausal): Secondary | ICD-10-CM

## 2022-09-22 DIAGNOSIS — N179 Acute kidney failure, unspecified: Principal | ICD-10-CM | POA: Diagnosis present

## 2022-09-22 DIAGNOSIS — Z79899 Other long term (current) drug therapy: Secondary | ICD-10-CM

## 2022-09-22 DIAGNOSIS — N139 Obstructive and reflux uropathy, unspecified: Secondary | ICD-10-CM | POA: Diagnosis present

## 2022-09-22 DIAGNOSIS — E875 Hyperkalemia: Secondary | ICD-10-CM | POA: Diagnosis present

## 2022-09-22 DIAGNOSIS — N401 Enlarged prostate with lower urinary tract symptoms: Secondary | ICD-10-CM | POA: Diagnosis present

## 2022-09-22 LAB — COMPREHENSIVE METABOLIC PANEL
ALT: 21 U/L (ref 0–44)
AST: 15 U/L (ref 15–41)
Albumin: 4 g/dL (ref 3.5–5.0)
Alkaline Phosphatase: 61 U/L (ref 38–126)
Anion gap: 13 (ref 5–15)
BUN: 99 mg/dL — ABNORMAL HIGH (ref 8–23)
CO2: 16 mmol/L — ABNORMAL LOW (ref 22–32)
Calcium: 9.2 mg/dL (ref 8.9–10.3)
Chloride: 114 mmol/L — ABNORMAL HIGH (ref 98–111)
Creatinine, Ser: 9.92 mg/dL — ABNORMAL HIGH (ref 0.61–1.24)
GFR, Estimated: 5 mL/min — ABNORMAL LOW (ref >60)
Glucose, Bld: 93 mg/dL (ref 70–99)
Potassium: 6.3 mmol/L (ref 3.5–5.1)
Sodium: 143 mmol/L (ref 135–145)
Total Bilirubin: 0.6 mg/dL (ref 0.3–1.2)
Total Protein: 7.1 g/dL (ref 6.5–8.1)

## 2022-09-22 LAB — BASIC METABOLIC PANEL
Anion gap: 9 (ref 5–15)
BUN: 97 mg/dL — ABNORMAL HIGH (ref 8–23)
CO2: 16 mmol/L — ABNORMAL LOW (ref 22–32)
Calcium: 9.3 mg/dL (ref 8.9–10.3)
Chloride: 118 mmol/L — ABNORMAL HIGH (ref 98–111)
Creatinine, Ser: 9.32 mg/dL — ABNORMAL HIGH (ref 0.61–1.24)
GFR, Estimated: 6 mL/min — ABNORMAL LOW (ref >60)
Glucose, Bld: 102 mg/dL — ABNORMAL HIGH (ref 70–99)
Potassium: 5 mmol/L (ref 3.5–5.1)
Sodium: 143 mmol/L (ref 135–145)

## 2022-09-22 LAB — CBC WITH DIFFERENTIAL/PLATELET
Abs Immature Granulocytes: 0.01 10*3/uL (ref 0.00–0.07)
Basophils Absolute: 0 10*3/uL (ref 0.0–0.1)
Basophils Relative: 1 %
Eosinophils Absolute: 0.2 10*3/uL (ref 0.0–0.5)
Eosinophils Relative: 4 %
HCT: 32.5 % — ABNORMAL LOW (ref 39.0–52.0)
Hemoglobin: 10.9 g/dL — ABNORMAL LOW (ref 13.0–17.0)
Immature Granulocytes: 0 %
Lymphocytes Relative: 15 %
Lymphs Abs: 0.8 10*3/uL (ref 0.7–4.0)
MCH: 31.8 pg (ref 26.0–34.0)
MCHC: 33.5 g/dL (ref 30.0–36.0)
MCV: 94.8 fL (ref 80.0–100.0)
Monocytes Absolute: 0.5 10*3/uL (ref 0.1–1.0)
Monocytes Relative: 9 %
Neutro Abs: 3.9 10*3/uL (ref 1.7–7.7)
Neutrophils Relative %: 71 %
Platelets: 219 10*3/uL (ref 150–400)
RBC: 3.43 MIL/uL — ABNORMAL LOW (ref 4.22–5.81)
RDW: 14.2 % (ref 11.5–15.5)
WBC: 5.5 10*3/uL (ref 4.0–10.5)
nRBC: 0 % (ref 0.0–0.2)

## 2022-09-22 LAB — URINALYSIS, ROUTINE W REFLEX MICROSCOPIC
Bilirubin Urine: NEGATIVE
Glucose, UA: NEGATIVE mg/dL
Hgb urine dipstick: NEGATIVE
Ketones, ur: NEGATIVE mg/dL
Nitrite: NEGATIVE
Protein, ur: 30 mg/dL — AB
Specific Gravity, Urine: 1.006 (ref 1.005–1.030)
pH: 6 (ref 5.0–8.0)

## 2022-09-22 LAB — CBG MONITORING, ED
Glucose-Capillary: 83 mg/dL (ref 70–99)
Glucose-Capillary: 99 mg/dL (ref 70–99)

## 2022-09-22 MED ORDER — TAMSULOSIN HCL 0.4 MG PO CAPS
0.4000 mg | ORAL_CAPSULE | Freq: Every day | ORAL | Status: DC
  Administered 2022-09-22 – 2022-09-30 (×9): 0.4 mg via ORAL
  Filled 2022-09-22 (×9): qty 1

## 2022-09-22 MED ORDER — FUROSEMIDE 10 MG/ML IJ SOLN
40.0000 mg | Freq: Once | INTRAMUSCULAR | Status: AC
  Administered 2022-09-22: 40 mg via INTRAVENOUS
  Filled 2022-09-22: qty 4

## 2022-09-22 MED ORDER — ACETAMINOPHEN 325 MG PO TABS
650.0000 mg | ORAL_TABLET | Freq: Four times a day (QID) | ORAL | Status: DC | PRN
  Administered 2022-09-23: 650 mg via ORAL
  Filled 2022-09-22: qty 2

## 2022-09-22 MED ORDER — SODIUM BICARBONATE 8.4 % IV SOLN
INTRAVENOUS | Status: AC
  Filled 2022-09-22: qty 1000

## 2022-09-22 MED ORDER — HYDROMORPHONE HCL 1 MG/ML IJ SOLN: 1.0000 mg | INTRAMUSCULAR | Status: DC | PRN

## 2022-09-22 MED ORDER — ONDANSETRON HCL 4 MG/2ML IJ SOLN: 4.0000 mg | Freq: Four times a day (QID) | INTRAMUSCULAR | Status: DC | PRN

## 2022-09-22 MED ORDER — FINASTERIDE 5 MG PO TABS
5.0000 mg | ORAL_TABLET | Freq: Every day | ORAL | Status: DC
  Administered 2022-09-22 – 2022-09-30 (×9): 5 mg via ORAL
  Filled 2022-09-22 (×9): qty 1

## 2022-09-22 MED ORDER — CALCIUM GLUCONATE 10 % IV SOLN
1.0000 g | Freq: Once | INTRAVENOUS | Status: AC
  Administered 2022-09-22: 1 g via INTRAVENOUS
  Filled 2022-09-22: qty 10

## 2022-09-22 MED ORDER — HYDRALAZINE HCL 20 MG/ML IJ SOLN
10.0000 mg | INTRAMUSCULAR | Status: DC | PRN
  Administered 2022-09-22: 10 mg via INTRAVENOUS
  Filled 2022-09-22: qty 1

## 2022-09-22 MED ORDER — SODIUM ZIRCONIUM CYCLOSILICATE 10 G PO PACK
10.0000 g | PACK | ORAL | Status: AC
  Administered 2022-09-22: 10 g via ORAL
  Filled 2022-09-22: qty 1

## 2022-09-22 MED ORDER — ATENOLOL 50 MG PO TABS
100.0000 mg | ORAL_TABLET | Freq: Every evening | ORAL | Status: DC
  Administered 2022-09-22 – 2022-09-29 (×8): 100 mg via ORAL
  Filled 2022-09-22 (×8): qty 2

## 2022-09-22 MED ORDER — HYDROCODONE-ACETAMINOPHEN 5-325 MG PO TABS
1.0000 | ORAL_TABLET | ORAL | Status: DC | PRN
  Administered 2022-09-22: 1 via ORAL
  Filled 2022-09-22: qty 1
  Filled 2022-09-22: qty 2

## 2022-09-22 MED ORDER — DEXTROSE 50 % IV SOLN
1.0000 | Freq: Once | INTRAVENOUS | Status: AC
  Administered 2022-09-22: 50 mL via INTRAVENOUS
  Filled 2022-09-22: qty 50

## 2022-09-22 MED ORDER — SODIUM CHLORIDE 0.9 % IV SOLN
1.0000 g | INTRAVENOUS | Status: AC
  Administered 2022-09-22: 1 g via INTRAVENOUS
  Filled 2022-09-22: qty 10

## 2022-09-22 MED ORDER — SODIUM CHLORIDE 0.9 % IV BOLUS
1000.0000 mL | Freq: Once | INTRAVENOUS | Status: AC
  Administered 2022-09-22: 1000 mL via INTRAVENOUS

## 2022-09-22 MED ORDER — ALBUTEROL SULFATE (2.5 MG/3ML) 0.083% IN NEBU: 10.0000 mg | INHALATION_SOLUTION | Freq: Once | RESPIRATORY_TRACT | Status: DC

## 2022-09-22 MED ORDER — SENNOSIDES-DOCUSATE SODIUM 8.6-50 MG PO TABS: 1.0000 | ORAL_TABLET | Freq: Every evening | ORAL | Status: DC | PRN

## 2022-09-22 MED ORDER — ONDANSETRON HCL 4 MG PO TABS: 4.0000 mg | ORAL_TABLET | Freq: Four times a day (QID) | ORAL | Status: DC | PRN

## 2022-09-22 MED ORDER — INSULIN ASPART 100 UNIT/ML IV SOLN
5.0000 [IU] | Freq: Once | INTRAVENOUS | Status: AC
  Administered 2022-09-22: 5 [IU] via INTRAVENOUS

## 2022-09-22 MED ORDER — ACETAMINOPHEN 650 MG RE SUPP: 650.0000 mg | Freq: Four times a day (QID) | RECTAL | Status: DC | PRN

## 2022-09-22 MED ORDER — LEVOTHYROXINE SODIUM 100 MCG PO TABS
100.0000 ug | ORAL_TABLET | Freq: Every day | ORAL | Status: DC
  Administered 2022-09-23 – 2022-09-30 (×7): 100 ug via ORAL
  Filled 2022-09-22 (×7): qty 1

## 2022-09-22 MED ORDER — SODIUM CHLORIDE 0.9% FLUSH
3.0000 mL | Freq: Two times a day (BID) | INTRAVENOUS | Status: DC
  Administered 2022-09-24 – 2022-09-29 (×7): 3 mL via INTRAVENOUS

## 2022-09-22 MED ORDER — SODIUM BICARBONATE 8.4 % IV SOLN
Freq: Once | INTRAVENOUS | Status: AC
  Filled 2022-09-22 (×2): qty 1000

## 2022-09-22 MED ORDER — ALBUTEROL SULFATE (2.5 MG/3ML) 0.083% IN NEBU
15.0000 mg | INHALATION_SOLUTION | Freq: Once | RESPIRATORY_TRACT | Status: AC
  Administered 2022-09-22: 15 mg via RESPIRATORY_TRACT
  Filled 2022-09-22: qty 18

## 2022-09-22 NOTE — Assessment & Plan Note (Addendum)
BPH with bladder outlet obstruction Creatinine 9.9 on admission (previously 0.69 in May 2022).  Acute renal failure is due to urinary retention from bladder outlet obstruction.  Foley placed in ED with at least 2500 mL output recorded.  Has some clear red urine post Foley placement. -Continue Foley care -Start back on Proscar and Flomax -Continue IV sodium bicarb infusion overnight -Monitor strict UOP and repeat labs in a.m.

## 2022-09-22 NOTE — H&P (Signed)
History and Physical    Thomas Beard [id number]:[id number] DOB: 01-Feb-1951 DOA: 09/22/2022  PCP: Patient, No Pcp Per  Patient coming from: Home  I have personally briefly reviewed patient's old medical records in [facility]  Chief Complaint: Urinary retention  HPI: Thomas Beard is a 71 y.o. male with medical history significant for HTN, hypothyroidism, BPH with history of urinary retention who presented to the ED for evaluation of urinary retention with abnormal outpatient labs.  Patient reports chronic issues with urine output due to enlarged prostate.  He was previously taking Proscar and Flomax but self discontinued these per his preference.  He had follow-up with his PCP recently where labs were obtained.  He was told that his renal function was worse and advised to come to the ED for further evaluation.  ED Course  Labs/Imaging on admission: I have personally reviewed following labs and imaging studies.  Initial vitals showed BP 193/110, pulse 58, RR 16, temp 97.2 F, SpO2 99% on room air.  Labs show potassium 6.3, bicarb 16, BUN 99, creatinine 9.92, sodium 143, serum glucose 93, LFTs within normal limits.  WBC 5.5, hemoglobin 10.9, platelets 219,000.  Urinalysis shows 30 protein, negative nitrates, trace leukocytes, 0-5 RBCs, 6-10 WBCs, rare bacteria on microscopy.  CT renal stone study shows marked bilateral hydronephrosis and bilateral hydroureter's.  No demonstratable opaque renal or ureteral stones.  Diffuse wall thickening and marked enlargement of prostate noted.  8 mm nodular density in the right middle lobe noted.  CT head without contrast negative for acute changes.  Patient was given IV Lasix 40 mg, 5 units NovoLog with D50, Lokelma, 15 mg albuterol nebulizer, 1 g IV calcium gluconate, sodium bicarbonate infusion.  Repeat potassium was 5.0.  Foley catheter placed with reported 2500 cc urine output.  The hospitalist service was consulted to admit for further  evaluation and management.  Review of Systems: All systems reviewed and are negative except as documented in history of present illness above.   Past Medical History:  Diagnosis Date   Enlarged prostate     History reviewed. No pertinent surgical history.  Social History:  reports that he has never smoked. He has never used smokeless tobacco. He reports that he does not drink alcohol and does not use drugs.  No Known Allergies  History reviewed. No pertinent family history.   Prior to Admission medications   Medication Sig Start Date End Date Taking? Authorizing Provider  atenolol (TENORMIN) 100 MG tablet Take 100 mg by mouth every evening. 09/06/22  Yes [provider]  levothyroxine (SYNTHROID) 100 MCG tablet Take 100 mcg by mouth daily before breakfast.   Yes [provider]  cephALEXin (KEFLEX) 500 MG capsule Take 1 capsule (500 mg total) by mouth 4 (four) times daily. Patient not taking: Reported on 09/22/2022 01/25/19   Mesner, Barbara Cower, MD  famotidine (PEPCID) 20 MG tablet Take 1 tablet (20 mg total) by mouth 2 (two) times daily. Patient not taking: Reported on 09/22/2022 02/07/21   Dietrich Pates, PA-C  finasteride (PROSCAR) 5 MG tablet Take 1 tablet (5 mg total) by mouth daily. Patient not taking: Reported on 09/22/2022 01/25/19   Mesner, Barbara Cower, MD  ibuprofen (ADVIL,MOTRIN) 800 MG tablet Take 1 tablet (800 mg total) by mouth 3 (three) times daily. Patient not taking: Reported on 09/22/2022 07/16/14   Elpidio Anis, PA-C  nitrofurantoin, macrocrystal-monohydrate, (MACROBID) 100 MG capsule Take 1 capsule (100 mg total) by mouth 2 (two) times daily. X 7 days Patient  not taking: Reported on 09/22/2022 01/30/19   Palumbo, April, MD  tamsulosin (FLOMAX) 0.4 MG CAPS capsule Take 1 capsule (0.4 mg total) by mouth daily. Patient not taking: Reported on 09/22/2022 01/30/19   Cy Blamer, MD    Physical Exam: Vitals:   09/22/22 1646 09/22/22 1700 09/22/22 1753 09/22/22  1845  BP:  (!) 186/101 (!) 189/100 (!) 181/98  Pulse:  62 61 (!) 59  Resp:  11 14 12   Temp: 98 F (36.7 C)     TempSrc: Oral     SpO2:  99% 99% 99%   Constitutional: Resting in bed, NAD, calm, comfortable Eyes: EOMI, lids and conjunctivae normal ENMT: Mucous membranes are moist. Posterior pharynx clear of any exudate or lesions.Normal dentition.  Neck: normal, supple, no masses. Respiratory: clear to auscultation bilaterally, no wheezing, no crackles. Normal respiratory effort. No accessory muscle use.  Cardiovascular: Regular rate and rhythm, no murmurs / rubs / gallops. No extremity edema. 2+ pedal pulses. Abdomen: no tenderness, no masses palpated.  GU: Foley catheter in place, clear red urine in Foley bag Musculoskeletal: no clubbing / cyanosis. No joint deformity upper and lower extremities. Good ROM, no contractures. Normal muscle tone.  Skin: no rashes, lesions, ulcers. No induration Neurologic: Sensation intact. Strength 5/5 in all 4.  Psychiatric: Alert and oriented x 3. Normal mood.   EKG: Personally reviewed. Sinus bradycardia, rate 53, T wave inversion leads I and aVL, LVH with repolarization changes.  No prior for comparison.  Assessment/Plan Principal Problem:   Acute renal failure due to urinary obstruction (HCC) Active Problems:   Essential hypertension   Hypothyroidism   Hyperkalemia   BPH with urinary obstruction   Lung nodule seen on imaging study   Thomas Beard is a 71 y.o. male with medical history significant for HTN, hypothyroidism, BPH with history of urinary retention who is admitted with acute renal failure due to bladder outlet obstruction from enlarged prostate.  Assessment and Plan: * Acute renal failure due to urinary obstruction (HCC) BPH with bladder outlet obstruction Creatinine 9.9 on admission (previously 0.69 in May 2022).  Acute renal failure is due to urinary retention from bladder outlet obstruction.  Foley placed in ED with at  least 2500 mL output recorded.  Has some clear red urine post Foley placement. -Continue Foley care -Start back on Proscar and Flomax -Continue IV sodium bicarb infusion overnight -Monitor strict UOP and repeat labs in a.m.  Essential hypertension Continue atenolol.  IV hydralazine as needed.  Lung nodule seen on imaging study 8 mm nodular density in the right middle lobe was seen incidentally on CT renal stone study.  Follow-up CT in 6 months may be considered.  Hyperkalemia Improved from 6.3 to 5.0 after initial treatment in the ED.  Repeat labs in AM.  Hypothyroidism Continue Synthroid.  DVT prophylaxis: SCDs Start: 09/22/22 1927 Code Status: Full code Family Communication: Daughter at bedside Disposition Plan: From home and likely discharge to home pending clinical progress Consults called: None Severity of Illness: The appropriate patient status for this patient is OBSERVATION. Observation status is judged to be reasonable and necessary in order to provide the required intensity of service to ensure the patient's safety. The patient's presenting symptoms, physical exam findings, and initial radiographic and laboratory data in the context of their medical condition is felt to place them at decreased risk for further clinical deterioration. Furthermore, it is anticipated that the patient will be medically stable for discharge from the hospital within 2 midnights  of admission.   Zada Finders MD [facility]  If 7PM-7AM, please contact night-coverage [email or web address]  09/22/2022, 7:35 PM

## 2022-09-22 NOTE — ED Triage Notes (Signed)
Pt came in POV d/t abnormal labs. Pt was at his family clinic where he was told to come to the ED d/t his abnormal creatinine and BUN number. Creatinine was 5.92, and BUN was 54. A&O X4. Not in apparent distress at this time.

## 2022-09-22 NOTE — ED Notes (Signed)
Received verbal report from Katrina RN at this time 

## 2022-09-22 NOTE — ED Provider Triage Note (Signed)
Emergency Medicine Provider Triage Evaluation Note  Thomas Beard , a 71 y.o. male  was evaluated in triage.  Pt complains of All lab values.  Patient has been having headaches and epistaxis, seen by primary care with labs which were remarkable for creatinine greater than 5 and BUN 54.  No history of renal disease, last creatinine in the chart was 1 year ago and was normal.  Patient denies any chest pain, shortness of breath, N, V. does endorse increased urinary frequency and urinary frequency.  History of prostatomegaly..  Not on any blood pressure medicines other than propranolol.   Review of Systems  Per HPI  Physical Exam  BP (!) 193/110 (BP Location: Left Arm)   Pulse (!) 58   Temp (!) 97.2 F (36.2 C) (Oral)   Resp 16   SpO2 99%  Gen:   Awake, no distress   Resp:  Normal effort  MSK:   Moves extremities without difficulty  Other:  Mild suprapubic tenderness, +murmur  Medical Decision Making  Medically screening exam initiated at 12:54 PM.  Appropriate orders placed.  Thomas Beard was informed that the remainder of the evaluation will be completed by another provider, this initial triage assessment does not replace that evaluation, and the importance of remaining in the ED until their evaluation is complete.  AKI/renal failure. Labs, UA, renal US ordered    Theron Arista, [place] 09/22/22 1257

## 2022-09-22 NOTE — Hospital Course (Signed)
Thomas Beard is a 71 y.o. male with medical history significant for HTN, hypothyroidism, BPH with history of urinary retention who is admitted with acute renal failure due to bladder outlet obstruction from enlarged prostate.

## 2022-09-22 NOTE — Assessment & Plan Note (Addendum)
Continue atenolol.  IV hydralazine as needed.

## 2022-09-22 NOTE — Plan of Care (Signed)

## 2022-09-22 NOTE — ED Notes (Signed)
Transport arrived to take pt to floor.

## 2022-09-22 NOTE — ED Provider Notes (Signed)
[facility] EMERGENCY DEPARTMENT Provider Note   CSN: [id number] Arrival date & time: 09/22/22  1113     History {Add pertinent medical, surgical, social history, OB history to HPI:1} Chief Complaint  Patient presents with   Abnormal Lab    Thomas Beard is a 71 y.o. male.  71 year old male with a history of HTN, BPH, and thyroid disorder on levothyroxine who presents to the emergency department with urinary retention and abnormal labs.  Over the past several weeks has had urinary retention which has been attributed to his BPH.  Also has had occasional headaches and nosebleeds.  Denies any additional bleeding including blood in the stool.  Went to [facility] recently and had labs which showed severe AKI and he was referred into the emergency department.  Says he currently has a headache and some suprapubic discomfort.  Has had dysuria.  Denies any fevers at home. No history of CHF.        Home Medications Prior to Admission medications   Medication Sig Start Date End Date Taking? Authorizing Provider  cephALEXin (KEFLEX) 500 MG capsule Take 1 capsule (500 mg total) by mouth 4 (four) times daily. 01/25/19   Mesner, Barbara Cower, MD  famotidine (PEPCID) 20 MG tablet Take 1 tablet (20 mg total) by mouth 2 (two) times daily. 02/07/21   Khatri, Hina, PA-C  finasteride (PROSCAR) 5 MG tablet Take 1 tablet (5 mg total) by mouth daily. 01/25/19   Mesner, Barbara Cower, MD  ibuprofen (ADVIL,MOTRIN) 800 MG tablet Take 1 tablet (800 mg total) by mouth 3 (three) times daily. 07/16/14   Elpidio Anis, PA-C  nitrofurantoin, macrocrystal-monohydrate, (MACROBID) 100 MG capsule Take 1 capsule (100 mg total) by mouth 2 (two) times daily. X 7 days 01/30/19   Palumbo, April, MD  tamsulosin (FLOMAX) 0.4 MG CAPS capsule Take 1 capsule (0.4 mg total) by mouth daily. 01/30/19   Palumbo, April, MD      Allergies    Patient has no known allergies.    Review of Systems   Review of  Systems  Physical Exam Updated Vital Signs BP (!) 193/110 (BP Location: Left Arm)   Pulse (!) 58   Temp (!) 97.2 F (36.2 C) (Oral)   Resp 16   SpO2 99%  Physical Exam Vitals and nursing note reviewed.  Constitutional:      General: He is not in acute distress.    Appearance: He is well-developed.  HENT:     Head: Normocephalic and atraumatic.     Right Ear: External ear normal.     Left Ear: External ear normal.     Nose: Nose normal.  Eyes:     Extraocular Movements: Extraocular movements intact.     Conjunctiva/sclera: Conjunctivae normal.     Pupils: Pupils are equal, round, and reactive to light.  Cardiovascular:     Rate and Rhythm: Regular rhythm. Bradycardia present.     Heart sounds: Normal heart sounds.  Pulmonary:     Effort: Pulmonary effort is normal. No respiratory distress.  Abdominal:     General: There is distension (Suprapubic).     Palpations: Abdomen is soft. There is no mass.     Tenderness: There is abdominal tenderness (Suprapubic and right mid abdomen). There is no guarding.  Musculoskeletal:     Cervical back: Normal range of motion and neck supple.     Right lower leg: No edema.     Left lower leg: No edema.  Skin:  General: Skin is warm and dry.  Neurological:     Mental Status: He is alert. Mental status is at baseline.  Psychiatric:        Mood and Affect: Mood normal.        Behavior: Behavior normal.     ED Results / Procedures / Treatments   Labs (all labs ordered are listed, but only abnormal results are displayed) Labs Reviewed  COMPREHENSIVE METABOLIC PANEL - Abnormal; Notable for the following components:      Result Value   Potassium 6.3 (*)    Chloride 114 (*)    CO2 16 (*)    BUN 99 (*)    Creatinine, Ser 9.92 (*)    GFR, Estimated 5 (*)    All other components within normal limits  CBC WITH DIFFERENTIAL/PLATELET - Abnormal; Notable for the following components:   RBC 3.43 (*)    Hemoglobin 10.9 (*)    HCT 32.5  (*)    All other components within normal limits  URINALYSIS, ROUTINE W REFLEX MICROSCOPIC - Abnormal; Notable for the following components:   Color, Urine STRAW (*)    Protein, ur 30 (*)    Leukocytes,Ua TRACE (*)    Bacteria, UA RARE (*)    All other components within normal limits  URINE CULTURE    EKG EKG Interpretation  Date/Time:  Wednesday September 22 2022 12:45:50 EST Ventricular Rate:  53 PR Interval:  148 QRS Duration: 100 QT Interval:  490 QTC Calculation: 459 R Axis:   54 Text Interpretation: Sinus bradycardia Left ventricular hypertrophy with repolarization abnormality ( Sokolow-Lyon ) Abnormal ECG No previous ECGs available Confirmed by Vonita Moss [phone]) on 09/22/2022 2:59:41 PM  Radiology No results found.  Procedures Procedures   Medications Ordered in ED Medications - No data to display  ED Course/ Medical Decision Making/ A&P Clinical Course as of 09/22/22 1529  Wed Sep 22, 2022  1526 Pulse Rate(!): 58 [RP]    Clinical Course User Index [RP] Rondel Baton, MD                           Medical Decision Making Amount and/or Complexity of Data Reviewed Radiology: ordered.  Risk OTC drugs. Prescription drug management. Decision regarding hospitalization.   Thomas Beard is a 71 y.o. male with comorbidities that complicate the patient evaluation including BPH who presents emergency department with urinary retention and AKI on outpatient labs  Initial Ddx:  Postrenal AKI, uremia, prerenal AKI, UTI  MDM:  Feel the patient likely has postrenal AKI.  With his dysuria may have a UTI that caused this so we will obtain urinalysis and urine culture.  With his nosebleeding and headache are concerned about possible uremia so we will obtain imaging at this time.  Will also obtain bladder scan and insert Foley if indicated.  Plan:  ***  ED Summary/Re-evaluation:  ***  This patient presents to the ED for concern of complaints listed  in HPI, this involves an extensive number of treatment options, and is a complaint that carries with it a high risk of complications and morbidity. Disposition including potential need for admission considered.   Dispo: {Disposition:28069}  Additional history obtained from {Additional History:28067} Records reviewed {Records Reviewed:28068} The following labs were independently interpreted: {labs interpreted:28064} and show {lab findings:28250} I independently reviewed the following imaging with scope of interpretation limited to determining acute life threatening conditions related to emergency care: {imaging interpreted:28065}, which revealed {  No acute abnormality:28066}  I personally reviewed and interpreted cardiac monitoring: {cardiac monitoring:28251} I personally reviewed and interpreted the pt's EKG: see above for interpretation  I have reviewed the patients home medications and made adjustments as needed Consults: {Consultants:28063} Social Determinants of health:  ***  {Document critical care time when appropriate:1} {Document POCUS if Performed:1}   {Document critical care time when appropriate:1} {Document review of labs and clinical decision tools ie heart score, Chads2Vasc2 etc:1}  {Document your independent review of radiology images, and any outside records:1} {Document your discussion with family members, caretakers, and with consultants:1} {Document social determinants of health affecting pt's care:1} {Document your decision making why or why not admission, treatments were needed:1} Final Clinical Impression(s) / ED Diagnoses Final diagnoses:  None    Rx / DC Orders ED Discharge Orders     None

## 2022-09-22 NOTE — Assessment & Plan Note (Signed)
8 mm nodular density in the right middle lobe was seen incidentally on CT renal stone study.  Follow-up CT in 6 months may be considered.

## 2022-09-22 NOTE — Assessment & Plan Note (Signed)
Improved from 6.3 to 5.0 after initial treatment in the ED.  Repeat labs in AM.

## 2022-09-22 NOTE — ED Notes (Signed)
Pt foley was placed at 1615 he had clear, yellow urine. Eventually patient c/o pain and began having blood in the catheter and ED MD was made aware.

## 2022-09-22 NOTE — Assessment & Plan Note (Signed)
Continue Synthroid °

## 2022-09-23 DIAGNOSIS — N179 Acute kidney failure, unspecified: Secondary | ICD-10-CM | POA: Diagnosis present

## 2022-09-23 LAB — BASIC METABOLIC PANEL
Anion gap: 12 (ref 5–15)
BUN: 92 mg/dL — ABNORMAL HIGH (ref 8–23)
CO2: 18 mmol/L — ABNORMAL LOW (ref 22–32)
Calcium: 8.6 mg/dL — ABNORMAL LOW (ref 8.9–10.3)
Chloride: 112 mmol/L — ABNORMAL HIGH (ref 98–111)
Creatinine, Ser: 8.62 mg/dL — ABNORMAL HIGH (ref 0.61–1.24)
GFR, Estimated: 6 mL/min — ABNORMAL LOW (ref >60)
Glucose, Bld: 139 mg/dL — ABNORMAL HIGH (ref 70–99)
Potassium: 4.8 mmol/L (ref 3.5–5.1)
Sodium: 142 mmol/L (ref 135–145)

## 2022-09-23 LAB — CBC
HCT: 30 % — ABNORMAL LOW (ref 39.0–52.0)
Hemoglobin: 10.4 g/dL — ABNORMAL LOW (ref 13.0–17.0)
MCH: 31.6 pg (ref 26.0–34.0)
MCHC: 34.7 g/dL (ref 30.0–36.0)
MCV: 91.2 fL (ref 80.0–100.0)
Platelets: 209 10*3/uL (ref 150–400)
RBC: 3.29 MIL/uL — ABNORMAL LOW (ref 4.22–5.81)
RDW: 14.1 % (ref 11.5–15.5)
WBC: 6.2 10*3/uL (ref 4.0–10.5)
nRBC: 0 % (ref 0.0–0.2)

## 2022-09-23 MED ORDER — SODIUM CHLORIDE 0.9 % IV SOLN: INTRAVENOUS | Status: DC

## 2022-09-23 NOTE — Plan of Care (Signed)

## 2022-09-23 NOTE — Progress Notes (Signed)
PROGRESS NOTE    Thomas Beard  ZOX:[id number] DOB: 03/01/1951 DOA: 09/22/2022 PCP: Patient, No Pcp Per   Brief Narrative:  HPI: Thomas Beard is a 71 y.o. male with medical history significant for HTN, hypothyroidism, BPH with history of urinary retention who presented to the ED for evaluation of urinary retention with abnormal outpatient labs.   Patient reports chronic issues with urine output due to enlarged prostate.  He was previously taking Proscar and Flomax but self discontinued these per his preference.  He had follow-up with his PCP recently where labs were obtained.  He was told that his renal function was worse and advised to come to the ED for further evaluation.   ED Course  Labs/Imaging on admission: I have personally reviewed following labs and imaging studies.   Initial vitals showed BP 193/110, pulse 58, RR 16, temp 97.2 F, SpO2 99% on room air.   Labs show potassium 6.3, bicarb 16, BUN 99, creatinine 9.92, sodium 143, serum glucose 93, LFTs within normal limits.  WBC 5.5, hemoglobin 10.9, platelets 219,000.   Urinalysis shows 30 protein, negative nitrates, trace leukocytes, 0-5 RBCs, 6-10 WBCs, rare bacteria on microscopy.   CT renal stone study shows marked bilateral hydronephrosis and bilateral hydroureter's.  No demonstratable opaque renal or ureteral stones.  Diffuse wall thickening and marked enlargement of prostate noted.  8 mm nodular density in the right middle lobe noted.   CT head without contrast negative for acute changes.   Patient was given IV Lasix 40 mg, 5 units NovoLog with D50, Lokelma, 15 mg albuterol nebulizer, 1 g IV calcium gluconate, sodium bicarbonate infusion.  Repeat potassium was 5.0.  Foley catheter placed with reported 2500 cc urine output.  The hospitalist service was consulted to admit for further evaluation and management.  Assessment & Plan:   Principal Problem:   Acute renal failure due to urinary obstruction  [facility](HCC) Active Problems:   Essential hypertension   Hypothyroidism   Hyperkalemia   BPH with urinary obstruction   Lung nodule seen on imaging study  Acute renal failure due to urinary obstruction (HCC) BPH with bladder outlet obstruction Creatinine 9.9 on admission (previously 0.69 in May 2022).  Acute renal failure is due to urinary retention from bladder outlet obstruction.  Foley placed in ED with at least 2500 mL output recorded.  Has some clear red urine post Foley placement.  Only slight improvement in creatinine compared to yesterday.  Continue Foley catheter, Proscar and Flomax resumed.  Will start him on gentle hydration as well.  Repeat labs in the morning.  UA negative for UTI.  Essential hypertension: Blood pressure controlled. Continue atenolol.  IV hydralazine as needed.   Lung nodule seen on imaging study 8 mm nodular density in the right middle lobe was seen incidentally on CT renal stone study.  Follow-up CT in 6 months may be considered.   Hyperkalemia Improved from 6.3 to 5.0 after initial treatment in the ED.     Hypothyroidism Continue Synthroid.   DVT prophylaxis: SCDs Start: 09/22/22 1927   Code Status: Full Code  Family Communication: Daughter present at bedside.  Plan of care discussed with patient in length and he/she verbalized understanding and agreed with it.  Status is: Observation The patient will require care spanning > 2 midnights and should be moved to inpatient because: Still with significantly elevated creatinine.   Estimated body mass index is 24.52 kg/m as calculated from the following:   Height as of 02/07/21: 5'  7" (1.702 m).   Weight as of 02/07/21: 71 kg.    Nutritional Assessment: There is no height or weight on file to calculate BMI.. Seen by dietician.  I agree with the assessment and plan as outlined below: Nutrition Status:        . Skin Assessment: I have examined the patient's skin and I agree with the wound assessment as  performed by the wound care RN as outlined below:    Consultants:  None  Procedures:  None  Antimicrobials:  Anti-infectives (From admission, onward)    Start     Dose/Rate Route Frequency Ordered Stop   09/22/22 1830  cefTRIAXone (ROCEPHIN) 1 g in sodium chloride 0.9 % 100 mL IVPB        1 g 200 mL/hr over 30 Minutes Intravenous STAT 09/22/22 1826 09/22/22 2035         Subjective: Patient seen and examined.  He has no complaints.  He feels better.  Daughter at the bedside.  Objective: Vitals:   09/22/22 1930 09/22/22 2015 09/22/22 2016 09/22/22 2017  BP: (!) 149/88  138/79   Pulse: 78 81 80 86  Resp: 17 13 13  (!) 23  Temp:   98.3 F (36.8 C)   TempSrc:   Oral   SpO2: 99% 98% 98% 97%    Intake/Output Summary (Last 24 hours) at 09/23/2022 0756 Last data filed at 09/23/2022 0500 Gross per 24 hour  Intake 1000 ml  Output 8350 ml  Net -7350 ml   There were no vitals filed for this visit.  Examination:  General exam: Appears calm and comfortable  Respiratory system: Clear to auscultation. Respiratory effort normal. Cardiovascular system: S1 & S2 heard, RRR. No JVD, murmurs, rubs, gallops or clicks. No pedal edema. Gastrointestinal system: Abdomen is nondistended, soft and nontender. No organomegaly or masses felt. Normal bowel sounds heard. Central nervous system: Alert and oriented. No focal neurological deficits. Extremities: Symmetric 5 x 5 power. Skin: No rashes, lesions or ulcers Psychiatry: Judgement and insight appear normal. Mood & affect appropriate.    Data Reviewed: I have personally reviewed following labs and imaging studies  CBC: Recent Labs  Lab 09/22/22 1251 09/23/22 0407  WBC 5.5 6.2  NEUTROABS 3.9  --   HGB 10.9* 10.4*  HCT 32.5* 30.0*  MCV 94.8 91.2  PLT 219 209   Basic Metabolic Panel: Recent Labs  Lab 09/22/22 1251 09/22/22 1705 09/23/22 0407  NA 143 143 142  K 6.3* 5.0 4.8  CL 114* 118* 112*  CO2 16* 16* 18*  GLUCOSE 93  102* 139*  BUN 99* 97* 92*  CREATININE 9.92* 9.32* 8.62*  CALCIUM 9.2 9.3 8.6*   GFR: CrCl cannot be calculated (Unknown ideal weight.). Liver Function Tests: Recent Labs  Lab 09/22/22 1251  AST 15  ALT 21  ALKPHOS 61  BILITOT 0.6  PROT 7.1  ALBUMIN 4.0   No results for input(s): "LIPASE", "AMYLASE" in the last 168 hours. No results for input(s): "AMMONIA" in the last 168 hours. Coagulation Profile: No results for input(s): "INR", "PROTIME" in the last 168 hours. Cardiac Enzymes: No results for input(s): "CKTOTAL", "CKMB", "CKMBINDEX", "TROPONINI" in the last 168 hours. BNP (last 3 results) No results for input(s): "PROBNP" in the last 8760 hours. HbA1C: No results for input(s): "HGBA1C" in the last 72 hours. CBG: Recent Labs  Lab 09/22/22 1529 09/22/22 1659  GLUCAP 83 99   Lipid Profile: No results for input(s): "CHOL", "HDL", "LDLCALC", "TRIG", "CHOLHDL", "LDLDIRECT" in the  last 72 hours. Thyroid Function Tests: No results for input(s): "TSH", "T4TOTAL", "FREET4", "T3FREE", "THYROIDAB" in the last 72 hours. Anemia Panel: No results for input(s): "VITAMINB12", "FOLATE", "FERRITIN", "TIBC", "IRON", "RETICCTPCT" in the last 72 hours. Sepsis Labs: No results for input(s): "PROCALCITON", "LATICACIDVEN" in the last 168 hours.  No results found for this or any previous visit (from the past 240 hour(s)).   Radiology Studies: CT Renal Stone Study  Result Date: 09/22/2022 CLINICAL DATA:  Abdominal pain, flank pain EXAM: CT ABDOMEN AND PELVIS WITHOUT CONTRAST TECHNIQUE: Multidetector CT imaging of the abdomen and pelvis was performed following the standard protocol without IV contrast. RADIATION DOSE REDUCTION: This exam was performed according to the departmental dose-optimization program which includes automated exposure control, adjustment of the mA and/or kV according to patient size and/or use of iterative reconstruction technique. COMPARISON:  CT done on 02/07/2021,  sonogram done earlier today FINDINGS: Lower chest: Small linear patchy infiltrates are seen in the posterior aspects of both lower lung fields. In image 1 of series 5, there is 8 mm nodular density in right middle lobe. Small faint radiopacities seen in lingula and right middle lobe. Hepatobiliary: There are multiple calcified gallbladder stones. There is no dilation of bile ducts. Pancreas: No focal abnormalities are seen. Spleen: Unremarkable. Adrenals/Urinary Tract: Adrenals are unremarkable. There is severe hydronephrosis in both kidneys. Both ureters are dilated. There is diffuse wall thickening in urinary bladder. Foley catheter is seen in the bladder. There is minimal stranding in the fat planes adjacent to the urinary bladder. Stomach/Bowel: Stomach is unremarkable. Small bowel loops are not dilated. Appendix is not seen. There is no significant wall thickening in colon. Scattered diverticula are seen in colon. There is no evidence of focal acute diverticulitis. Vascular/Lymphatic: Scattered arterial calcifications are seen. Reproductive: There is marked enlargement of prostate. Other: There is no ascites or pneumoperitoneum. Small umbilical hernia containing fat is seen. Right inguinal hernia containing fat is seen. Musculoskeletal: Degenerative changes are noted with encroachment of neural foramina at L4-L5 level, more so on the left side. IMPRESSION: There is no evidence of intestinal obstruction or pneumoperitoneum. There is marked bilateral hydronephrosis and bilateral hydroureters. There are no demonstrable opaque renal or ureteral stones. There is marked diffuse wall thickening in the urinary bladder which may be due to cystitis or chronic outlet obstruction. There is mild stranding in the fat planes adjacent to the urinary bladder. This may be due to chronic bladder outlet obstruction or cystitis. There is marked enlargement of prostate. Gallbladder stones.  There are no signs of acute cholecystitis.  Diverticulosis of colon without signs of focal diverticulitis. Lumbar spondylosis. There are small patchy infiltrates in both lower lung fields suggesting atelectasis/pneumonia. There is 8 mm nodular density in right middle lobe which may be part of pneumonia or parenchymal nodule such as granuloma or neoplasm. Follow-up CT in 6 months may be considered. Other findings as described in the body of the report. Electronically Signed   By: Ernie Avena M.D.   On: 09/22/2022 17:40   CT Head Wo Contrast  Result Date: 09/22/2022 CLINICAL DATA:  New onset headache EXAM: CT HEAD WITHOUT CONTRAST TECHNIQUE: Contiguous axial images were obtained from the base of the skull through the vertex without intravenous contrast. RADIATION DOSE REDUCTION: This exam was performed according to the departmental dose-optimization program which includes automated exposure control, adjustment of the mA and/or kV according to patient size and/or use of iterative reconstruction technique. COMPARISON:  None Available. FINDINGS: Brain: No evidence  of acute infarction, hemorrhage, hydrocephalus, extra-axial collection or mass lesion/mass effect. Vascular: Negative for hyperdense vessel Skull: Negative Sinuses/Orbits: Mild mucosal edema maxillary sinus bilaterally. Remaining sinuses clear. Negative orbit Other: None IMPRESSION: Negative CT head. Electronically Signed   By: Marlan Palau M.D.   On: 09/22/2022 17:26   US Renal  Result Date: 09/22/2022 CLINICAL DATA:  Acute kidney injury EXAM: RENAL / URINARY TRACT ULTRASOUND COMPLETE COMPARISON:  CT abdomen and pelvis dated Feb 07, 2021 FINDINGS: Right Kidney: Renal measurements: 11.8 x 6.5 x 5.7 cm = volume: 231.1 mL. Echogenicity within normal limits. Severe hydronephrosis. Left Kidney: Renal measurements: 12.4 x 7.5 x 6.2 cm = volume: 303.1 mL. Echogenicity within normal limits. Severe hydronephrosis. Multiple calculi are seen, largest measures 11 mm and is located in the  interpolar region. Possible calculus of the proximal left ureter. Bladder: Distended urinary bladder with prevoid volume is 1,500 mL, postvoid images demonstrate no significant change in volume (reported as 1,352 mL, likely due to differences in measurement technique). Bilateral jets are visualized. Other: Prostatomegaly. IMPRESSION: 1. Severe bilateral hydronephrosis. 2. Possible calculus of the proximal left ureter. Recommend CT for further evaluation 3. Prostatomegaly with evidence of urinary retention. Electronically Signed   By: Allegra Lai M.D.   On: 09/22/2022 15:09    Scheduled Meds:  atenolol  100 mg Oral QPM   finasteride  5 mg Oral Daily   levothyroxine  100 mcg Oral QAC breakfast   sodium chloride flush  3 mL Intravenous Q12H   tamsulosin  0.4 mg Oral Daily   Continuous Infusions:  sodium chloride       LOS: 0 days   Hughie Closs, MD [facility]  09/23/2022, 7:56 AM   *Please note that this is a verbal dictation therefore any spelling or grammatical errors are due to the "[employer] One" system interpretation.  Please page via Amion and do not message via secure chat for urgent patient care matters. Secure chat can be used for non urgent patient care matters.  How to contact the [facility] Attending or Consulting provider 7A - 7P or covering provider during after hours 7P -7A, for this patient?  Check the care team in [facility] and look for a) attending/consulting [facility] provider listed and b) the [facility] team listed. Page or secure chat 7A-7P. Log into [email or web address] and use [place]'s universal password to access. If you do not have the password, please contact the hospital operator. Locate the [facility] provider you are looking for under [facility] and page to a number that you can be directly reached. If you still have difficulty reaching the provider, please page the [facility] (Director on Call) for the Hospitalists listed on amion for assistance.

## 2022-09-24 LAB — URINE CULTURE: Culture: NO GROWTH

## 2022-09-24 LAB — BASIC METABOLIC PANEL
Anion gap: 12 (ref 5–15)
BUN: 82 mg/dL — ABNORMAL HIGH (ref 8–23)
CO2: 18 mmol/L — ABNORMAL LOW (ref 22–32)
Calcium: 7.9 mg/dL — ABNORMAL LOW (ref 8.9–10.3)
Chloride: 113 mmol/L — ABNORMAL HIGH (ref 98–111)
Creatinine, Ser: 7.75 mg/dL — ABNORMAL HIGH (ref 0.61–1.24)
GFR, Estimated: 7 mL/min — ABNORMAL LOW (ref >60)
Glucose, Bld: 99 mg/dL (ref 70–99)
Potassium: 4.4 mmol/L (ref 3.5–5.1)
Sodium: 143 mmol/L (ref 135–145)

## 2022-09-24 MED ORDER — CHLORHEXIDINE GLUCONATE CLOTH 2 % EX PADS
6.0000 | MEDICATED_PAD | Freq: Every day | CUTANEOUS | Status: DC
  Administered 2022-09-24 – 2022-09-30 (×7): 6 via TOPICAL

## 2022-09-24 NOTE — Progress Notes (Signed)
PROGRESS NOTE    Thomas Beard  PPI:[id number] DOB: [birth year] DOA: 09/22/2022 PCP: Patient, No Pcp Per   Brief Narrative:  HPI: Thomas Beard is a 72 y.o. male with medical history significant for HTN, hypothyroidism, BPH with history of urinary retention who presented to the ED for evaluation of urinary retention with abnormal outpatient labs.   Patient reports chronic issues with urine output due to enlarged prostate.  He was previously taking Proscar and Flomax but self discontinued these per his preference.  He had follow-up with his PCP recently where labs were obtained.  He was told that his renal function was worse and advised to come to the ED for further evaluation.   ED Course  Labs/Imaging on admission: I have personally reviewed following labs and imaging studies.   Initial vitals showed BP 193/110, pulse 58, RR 16, temp 97.2 F, SpO2 99% on room air.   Labs show potassium 6.3, bicarb 16, BUN 99, creatinine 9.92, sodium 143, serum glucose 93, LFTs within normal limits.  WBC 5.5, hemoglobin 10.9, platelets 219,000.   Urinalysis shows 30 protein, negative nitrates, trace leukocytes, 0-5 RBCs, 6-10 WBCs, rare bacteria on microscopy.   CT renal stone study shows marked bilateral hydronephrosis and bilateral hydroureter's.  No demonstratable opaque renal or ureteral stones.  Diffuse wall thickening and marked enlargement of prostate noted.  8 mm nodular density in the right middle lobe noted.   CT head without contrast negative for acute changes.   Patient was given IV Lasix 40 mg, 5 units NovoLog with D50, Lokelma, 15 mg albuterol nebulizer, 1 g IV calcium gluconate, sodium bicarbonate infusion.  Repeat potassium was 5.0.  Foley catheter placed with reported 2500 cc urine output.  The hospitalist service was consulted to admit for further evaluation and management.  Assessment & Plan:   Principal Problem:   Acute renal failure due to urinary obstruction  [facility]) Active Problems:   Essential hypertension   Hypothyroidism   Hyperkalemia   BPH with urinary obstruction   Lung nodule seen on imaging study   AKI (acute kidney injury) (HCC)  Acute renal failure due to urinary obstruction (HCC) BPH with bladder outlet obstruction Creatinine 9.9 on admission (previously 0.69 in May 2022).  Acute renal failure is due to urinary retention from bladder outlet obstruction.  Foley placed in ED with at least 2500 mL output recorded.  Has some clear red urine post Foley placement.  Some more improvement in creatinine, continue Foley catheter, Proscar and Flomax resumed.   Essential hypertension: Blood pressure controlled. Continue atenolol.  IV hydralazine as needed.   Lung nodule seen on imaging study 8 mm nodular density in the right middle lobe was seen incidentally on CT renal stone study.  Follow-up CT in 6 months may be considered.   Hyperkalemia Improved from 6.3 to 5.0 after initial treatment in the ED.     Hypothyroidism Continue Synthroid.   DVT prophylaxis: SCDs Start: 09/22/22 1927   Code Status: Full Code  Family Communication: Daughter present at bedside.  Plan of care discussed with patient in length and he/she verbalized understanding and agreed with it.  Status is: Inpatient Remains inpatient appropriate because: Creatinine is still significantly elevated but improving.    Estimated body mass index is 24.52 kg/m as calculated from the following:   Height as of 02/07/21: 5\' 7"  (1.702 m).   Weight as of 02/07/21: 71 kg.    Nutritional Assessment: There is no height or weight on file to  calculate BMI.. Seen by dietician.  I agree with the assessment and plan as outlined below: Nutrition Status:        . Skin Assessment: I have examined the patient's skin and I agree with the wound assessment as performed by the wound care RN as outlined below:    Consultants:  None  Procedures:  None  Antimicrobials:   Anti-infectives (From admission, onward)    Start     Dose/Rate Route Frequency Ordered Stop   09/22/22 1830  cefTRIAXone (ROCEPHIN) 1 g in sodium chloride 0.9 % 100 mL IVPB        1 g 200 mL/hr over 30 Minutes Intravenous STAT 09/22/22 1826 09/22/22 2035         Subjective:  Patient seen and examined.  He has no complaints.  Daughter at the bedside.  Objective: Vitals:   09/23/22 0842 09/23/22 1433 09/23/22 1957 09/24/22 0757  BP: (!) 142/96 105/63 135/79 (!) 154/94  Pulse: 77 72 65 61  Resp: 12 13 16 11   Temp: 98.2 F (36.8 C) 98.2 F (36.8 C) 98.4 F (36.9 C) 97.9 F (36.6 C)  TempSrc: Oral Oral Oral Oral  SpO2: 97% 96% 97% 96%    Intake/Output Summary (Last 24 hours) at 09/24/2022 1048 Last data filed at 09/24/2022 0500 Gross per 24 hour  Intake 480 ml  Output 3450 ml  Net -2970 ml    There were no vitals filed for this visit.  Examination:  General exam: Appears calm and comfortable  Respiratory system: Clear to auscultation. Respiratory effort normal. Cardiovascular system: S1 & S2 heard, RRR. No JVD, murmurs, rubs, gallops or clicks. No pedal edema. Gastrointestinal system: Abdomen is nondistended, soft and nontender. No organomegaly or masses felt. Normal bowel sounds heard. Central nervous system: Alert and oriented. No focal neurological deficits. Extremities: Symmetric 5 x 5 power. Skin: No rashes, lesions or ulcers.  Psychiatry: Judgement and insight appear normal. Mood & affect appropriate.   Data Reviewed: I have personally reviewed following labs and imaging studies  CBC: Recent Labs  Lab 09/22/22 1251 09/23/22 0407  WBC 5.5 6.2  NEUTROABS 3.9  --   HGB 10.9* 10.4*  HCT 32.5* 30.0*  MCV 94.8 91.2  PLT 219 209    Basic Metabolic Panel: Recent Labs  Lab 09/22/22 1251 09/22/22 1705 09/23/22 0407 09/24/22 0304  NA 143 143 142 143  K 6.3* 5.0 4.8 4.4  CL 114* 118* 112* 113*  CO2 16* 16* 18* 18*  GLUCOSE 93 102* 139* 99  BUN 99*  97* 92* 82*  CREATININE 9.92* 9.32* 8.62* 7.75*  CALCIUM 9.2 9.3 8.6* 7.9*    GFR: CrCl cannot be calculated (Unknown ideal weight.). Liver Function Tests: Recent Labs  Lab 09/22/22 1251  AST 15  ALT 21  ALKPHOS 61  BILITOT 0.6  PROT 7.1  ALBUMIN 4.0    No results for input(s): "LIPASE", "AMYLASE" in the last 168 hours. No results for input(s): "AMMONIA" in the last 168 hours. Coagulation Profile: No results for input(s): "INR", "PROTIME" in the last 168 hours. Cardiac Enzymes: No results for input(s): "CKTOTAL", "CKMB", "CKMBINDEX", "TROPONINI" in the last 168 hours. BNP (last 3 results) No results for input(s): "PROBNP" in the last 8760 hours. HbA1C: No results for input(s): "HGBA1C" in the last 72 hours. CBG: Recent Labs  Lab 09/22/22 1529 09/22/22 1659  GLUCAP 83 99    Lipid Profile: No results for input(s): "CHOL", "HDL", "LDLCALC", "TRIG", "CHOLHDL", "LDLDIRECT" in the last 72 hours. Thyroid  Function Tests: No results for input(s): "TSH", "T4TOTAL", "FREET4", "T3FREE", "THYROIDAB" in the last 72 hours. Anemia Panel: No results for input(s): "VITAMINB12", "FOLATE", "FERRITIN", "TIBC", "IRON", "RETICCTPCT" in the last 72 hours. Sepsis Labs: No results for input(s): "PROCALCITON", "LATICACIDVEN" in the last 168 hours.  Recent Results (from the past 240 hour(s))  Urine Culture     Status: None   Collection Time: 09/22/22  1:40 PM   Specimen: Urine, Clean Catch  Result Value Ref Range Status   Specimen Description URINE, CLEAN CATCH  Final   Special Requests NONE  Final   Culture   Final    NO GROWTH Performed at [facility], [address], [place], [place] [address]    Report Status 09/24/2022 FINAL  Final     Radiology Studies: CT Renal Stone Study  Result Date: 09/22/2022 CLINICAL DATA:  Abdominal pain, flank pain EXAM: CT ABDOMEN AND PELVIS WITHOUT CONTRAST TECHNIQUE: Multidetector CT imaging of the abdomen and pelvis was performed  following the standard protocol without IV contrast. RADIATION DOSE REDUCTION: This exam was performed according to the departmental dose-optimization program which includes automated exposure control, adjustment of the mA and/or kV according to patient size and/or use of iterative reconstruction technique. COMPARISON:  CT done on 02/07/2021, sonogram done earlier today FINDINGS: Lower chest: Small linear patchy infiltrates are seen in the posterior aspects of both lower lung fields. In image 1 of series 5, there is 8 mm nodular density in right middle lobe. Small faint radiopacities seen in lingula and right middle lobe. Hepatobiliary: There are multiple calcified gallbladder stones. There is no dilation of bile ducts. Pancreas: No focal abnormalities are seen. Spleen: Unremarkable. Adrenals/Urinary Tract: Adrenals are unremarkable. There is severe hydronephrosis in both kidneys. Both ureters are dilated. There is diffuse wall thickening in urinary bladder. Foley catheter is seen in the bladder. There is minimal stranding in the fat planes adjacent to the urinary bladder. Stomach/Bowel: Stomach is unremarkable. Small bowel loops are not dilated. Appendix is not seen. There is no significant wall thickening in colon. Scattered diverticula are seen in colon. There is no evidence of focal acute diverticulitis. Vascular/Lymphatic: Scattered arterial calcifications are seen. Reproductive: There is marked enlargement of prostate. Other: There is no ascites or pneumoperitoneum. Small umbilical hernia containing fat is seen. Right inguinal hernia containing fat is seen. Musculoskeletal: Degenerative changes are noted with encroachment of neural foramina at L4-L5 level, more so on the left side. IMPRESSION: There is no evidence of intestinal obstruction or pneumoperitoneum. There is marked bilateral hydronephrosis and bilateral hydroureters. There are no demonstrable opaque renal or ureteral stones. There is marked diffuse  wall thickening in the urinary bladder which may be due to cystitis or chronic outlet obstruction. There is mild stranding in the fat planes adjacent to the urinary bladder. This may be due to chronic bladder outlet obstruction or cystitis. There is marked enlargement of prostate. Gallbladder stones.  There are no signs of acute cholecystitis. Diverticulosis of colon without signs of focal diverticulitis. Lumbar spondylosis. There are small patchy infiltrates in both lower lung fields suggesting atelectasis/pneumonia. There is 8 mm nodular density in right middle lobe which may be part of pneumonia or parenchymal nodule such as granuloma or neoplasm. Follow-up CT in 6 months may be considered. Other findings as described in the body of the report. Electronically Signed   By: Ernie Avena M.D.   On: 09/22/2022 17:40   CT Head Wo Contrast  Result Date: 09/22/2022 CLINICAL DATA:  New onset headache EXAM: CT HEAD WITHOUT CONTRAST TECHNIQUE: Contiguous axial images were obtained from the base of the skull through the vertex without intravenous contrast. RADIATION DOSE REDUCTION: This exam was performed according to the departmental dose-optimization program which includes automated exposure control, adjustment of the mA and/or kV according to patient size and/or use of iterative reconstruction technique. COMPARISON:  None Available. FINDINGS: Brain: No evidence of acute infarction, hemorrhage, hydrocephalus, extra-axial collection or mass lesion/mass effect. Vascular: Negative for hyperdense vessel Skull: Negative Sinuses/Orbits: Mild mucosal edema maxillary sinus bilaterally. Remaining sinuses clear. Negative orbit Other: None IMPRESSION: Negative CT head. Electronically Signed   By: Marlan Palauharles  Clark M.D.   On: 09/22/2022 17:26   US Renal  Result Date: 09/22/2022 CLINICAL DATA:  Acute kidney injury EXAM: RENAL / URINARY TRACT ULTRASOUND COMPLETE COMPARISON:  CT abdomen and pelvis dated Feb 07, 2021  FINDINGS: Right Kidney: Renal measurements: 11.8 x 6.5 x 5.7 cm = volume: 231.1 mL. Echogenicity within normal limits. Severe hydronephrosis. Left Kidney: Renal measurements: 12.4 x 7.5 x 6.2 cm = volume: 303.1 mL. Echogenicity within normal limits. Severe hydronephrosis. Multiple calculi are seen, largest measures 11 mm and is located in the interpolar region. Possible calculus of the proximal left ureter. Bladder: Distended urinary bladder with prevoid volume is 1,500 mL, postvoid images demonstrate no significant change in volume (reported as 1,352 mL, likely due to differences in measurement technique). Bilateral jets are visualized. Other: Prostatomegaly. IMPRESSION: 1. Severe bilateral hydronephrosis. 2. Possible calculus of the proximal left ureter. Recommend CT for further evaluation 3. Prostatomegaly with evidence of urinary retention. Electronically Signed   By: Allegra LaiLeah  Strickland M.D.   On: 09/22/2022 15:09    Scheduled Meds:  atenolol  100 mg Oral QPM   Chlorhexidine Gluconate Cloth  6 each Topical Daily   finasteride  5 mg Oral Daily   levothyroxine  100 mcg Oral QAC breakfast   sodium chloride flush  3 mL Intravenous Q12H   tamsulosin  0.4 mg Oral Daily   Continuous Infusions:  sodium chloride 75 mL/hr at 09/23/22 0839     LOS: 1 day   Hughie Clossavi Mateya Torti, MD [facility]  09/24/2022, 10:48 AM   *Please note that this is a verbal dictation therefore any spelling or grammatical errors are due to the "Dragon Medical One" system interpretation.  Please page via Amion and do not message via secure chat for urgent patient care matters. Secure chat can be used for non urgent patient care matters.  How to contact the [facility] Attending or Consulting provider 7A - 7P or covering provider during after hours 7P -7A, for this patient?  Check the care team in [facility] and look for a) attending/consulting TR[facility] provider listed and b) the [facility] team listed. Page or secure chat 7A-7P. Log into [email or web address]  and use [place]'s universal password to access. If you do not have the password, please contact the hospital operator. Locate the [facility] provider you are looking for under [facility] and page to a number that you can be directly reached. If you still have difficulty reaching the provider, please page the [facility] (Director on Call) for the Hospitalists listed on amion for assistance.

## 2022-09-25 LAB — BASIC METABOLIC PANEL
Anion gap: 9 (ref 5–15)
BUN: 68 mg/dL — ABNORMAL HIGH (ref 8–23)
CO2: 18 mmol/L — ABNORMAL LOW (ref 22–32)
Calcium: 8.2 mg/dL — ABNORMAL LOW (ref 8.9–10.3)
Chloride: 116 mmol/L — ABNORMAL HIGH (ref 98–111)
Creatinine, Ser: 5.79 mg/dL — ABNORMAL HIGH (ref 0.61–1.24)
GFR, Estimated: 10 mL/min — ABNORMAL LOW (ref >60)
Glucose, Bld: 97 mg/dL (ref 70–99)
Potassium: 4.1 mmol/L (ref 3.5–5.1)
Sodium: 143 mmol/L (ref 135–145)

## 2022-09-25 NOTE — Progress Notes (Signed)
PROGRESS NOTE    Thomas Beard  PXT:[id number] DOB: 1951-04-07 DOA: 09/22/2022 PCP: Patient, No Pcp Per   Brief Narrative:  HPI: Thomas Beard is a 71 y.o. male with medical history significant for HTN, hypothyroidism, BPH with history of urinary retention who presented to the ED for evaluation of urinary retention with abnormal outpatient labs.   Patient reports chronic issues with urine output due to enlarged prostate.  He was previously taking Proscar and Flomax but self discontinued these per his preference.  He had follow-up with his PCP recently where labs were obtained.  He was told that his renal function was worse and advised to come to the ED for further evaluation.   ED Course  Labs/Imaging on admission: I have personally reviewed following labs and imaging studies.   Initial vitals showed BP 193/110, pulse 58, RR 16, temp 97.2 F, SpO2 99% on room air.   Labs show potassium 6.3, bicarb 16, BUN 99, creatinine 9.92, sodium 143, serum glucose 93, LFTs within normal limits.  WBC 5.5, hemoglobin 10.9, platelets 219,000.   Urinalysis shows 30 protein, negative nitrates, trace leukocytes, 0-5 RBCs, 6-10 WBCs, rare bacteria on microscopy.   CT renal stone study shows marked bilateral hydronephrosis and bilateral hydroureter's.  No demonstratable opaque renal or ureteral stones.  Diffuse wall thickening and marked enlargement of prostate noted.  8 mm nodular density in the right middle lobe noted.   CT head without contrast negative for acute changes.   Patient was given IV Lasix 40 mg, 5 units NovoLog with D50, Lokelma, 15 mg albuterol nebulizer, 1 g IV calcium gluconate, sodium bicarbonate infusion.  Repeat potassium was 5.0.  Foley catheter placed with reported 2500 cc urine output.  The hospitalist service was consulted to admit for further evaluation and management.  Assessment & Plan:   Principal Problem:   Acute renal failure due to urinary obstruction  [facility]) Active Problems:   Essential hypertension   Hypothyroidism   Hyperkalemia   BPH with urinary obstruction   Lung nodule seen on imaging study   AKI (acute kidney injury) (HCC)  Acute renal failure due to urinary obstruction (HCC) BPH with bladder outlet obstruction Creatinine 9.9 on admission (previously 0.69 in May 2022).  Acute renal failure is due to urinary retention from bladder outlet obstruction.  Foley placed in ED with at least 2500 mL output recorded.  He is net -32 L now.  Somewhat improvement in creatinine, continue Flomax and Proscar.  May need to stay in the hospital for 1-2 more days.  Discussed with daughter at the bedside.  Essential hypertension: Blood pressure controlled. Continue atenolol.  IV hydralazine as needed.   Lung nodule seen on imaging study 8 mm nodular density in the right middle lobe was seen incidentally on CT renal stone study.  Follow-up CT in 6 months may be considered.   Hyperkalemia Improved from 6.3 to 5.0 after initial treatment in the ED.     Hypothyroidism Continue Synthroid.   DVT prophylaxis: SCDs Start: 09/22/22 1927   Code Status: Full Code  Family Communication: Daughter present at bedside.  Plan of care discussed with patient in length and he/she verbalized understanding and agreed with it.  Status is: Inpatient Remains inpatient appropriate because: Creatinine is still significantly elevated but improving.    Estimated body mass index is 24.52 kg/m as calculated from the following:   Height as of 02/07/21: 5\' 7"  (1.702 m).   Weight as of 02/07/21: 71 kg.  Nutritional Assessment: There is no height or weight on file to calculate BMI.. Seen by dietician.  I agree with the assessment and plan as outlined below: Nutrition Status:        . Skin Assessment: I have examined the patient's skin and I agree with the wound assessment as performed by the wound care RN as outlined below:    Consultants:   None  Procedures:  None  Antimicrobials:  Anti-infectives (From admission, onward)    Start     Dose/Rate Route Frequency Ordered Stop   09/22/22 1830  cefTRIAXone (ROCEPHIN) 1 g in sodium chloride 0.9 % 100 mL IVPB        1 g 200 mL/hr over 30 Minutes Intravenous STAT 09/22/22 1826 09/22/22 2035         Subjective:  Seen in [place] patient has no complaints.  He is just frustrated for being in the hospital.  Wants to get out of here as soon as possible.  Does not like the Foley.  Objective: Vitals:   09/24/22 1847 09/25/22 0000 09/25/22 0429 09/25/22 0749  BP: (!) 141/85  109/81 (!) 150/89  Pulse: 63  71 67  Resp: 11  14 15   Temp: 98.1 F (36.7 C) 98 F (36.7 C) 98.7 F (37.1 C) 97.7 F (36.5 C)  TempSrc: Oral Oral Oral Oral  SpO2: 97%  96% 95%    Intake/Output Summary (Last 24 hours) at 09/25/2022 1036 Last data filed at 09/25/2022 0900 Gross per 24 hour  Intake 3174.97 ml  Output 09/27/2022 ml  Net -22025.03 ml    There were no vitals filed for this visit.  Examination:  General exam: Appears calm and comfortable  Respiratory system: Clear to auscultation. Respiratory effort normal. Cardiovascular system: S1 & S2 heard, RRR. No JVD, murmurs, rubs, gallops or clicks. No pedal edema. Gastrointestinal system: Abdomen is nondistended, soft and nontender. No organomegaly or masses felt. Normal bowel sounds heard. Central nervous system: Alert and oriented. No focal neurological deficits. Extremities: Symmetric 5 x 5 power. Skin: No rashes, lesions or ulcers.  Psychiatry: Judgement and insight appear normal. Mood & affect appropriate.   Data Reviewed: I have personally reviewed following labs and imaging studies  CBC: Recent Labs  Lab 09/22/22 1251 09/23/22 0407  WBC 5.5 6.2  NEUTROABS 3.9  --   HGB 10.9* 10.4*  HCT 32.5* 30.0*  MCV 94.8 91.2  PLT 219 209    Basic Metabolic Panel: Recent Labs  Lab 09/22/22 1251 09/22/22 1705 09/23/22 0407  09/24/22 0304 09/25/22 0228  NA 143 143 142 143 143  K 6.3* 5.0 4.8 4.4 4.1  CL 114* 118* 112* 113* 116*  CO2 16* 16* 18* 18* 18*  GLUCOSE 93 102* 139* 99 97  BUN 99* 97* 92* 82* 68*  CREATININE 9.92* 9.32* 8.62* 7.75* 5.79*  CALCIUM 9.2 9.3 8.6* 7.9* 8.2*    GFR: CrCl cannot be calculated (Unknown ideal weight.). Liver Function Tests: Recent Labs  Lab 09/22/22 1251  AST 15  ALT 21  ALKPHOS 61  BILITOT 0.6  PROT 7.1  ALBUMIN 4.0    No results for input(s): "LIPASE", "AMYLASE" in the last 168 hours. No results for input(s): "AMMONIA" in the last 168 hours. Coagulation Profile: No results for input(s): "INR", "PROTIME" in the last 168 hours. Cardiac Enzymes: No results for input(s): "CKTOTAL", "CKMB", "CKMBINDEX", "TROPONINI" in the last 168 hours. BNP (last 3 results) No results for input(s): "PROBNP" in the last 8760 hours. HbA1C: No results for  input(s): "HGBA1C" in the last 72 hours. CBG: Recent Labs  Lab 09/22/22 1529 09/22/22 1659  GLUCAP 83 99    Lipid Profile: No results for input(s): "CHOL", "HDL", "LDLCALC", "TRIG", "CHOLHDL", "LDLDIRECT" in the last 72 hours. Thyroid Function Tests: No results for input(s): "TSH", "T4TOTAL", "FREET4", "T3FREE", "THYROIDAB" in the last 72 hours. Anemia Panel: No results for input(s): "VITAMINB12", "FOLATE", "FERRITIN", "TIBC", "IRON", "RETICCTPCT" in the last 72 hours. Sepsis Labs: No results for input(s): "PROCALCITON", "LATICACIDVEN" in the last 168 hours.  Recent Results (from the past 240 hour(s))  Urine Culture     Status: None   Collection Time: 09/22/22  1:40 PM   Specimen: Urine, Clean Catch  Result Value Ref Range Status   Specimen Description URINE, CLEAN CATCH  Final   Special Requests NONE  Final   Culture   Final    NO GROWTH Performed at [facility], [address] [place], [place] [address]    Report Status 09/24/2022 FINAL  Final     Radiology Studies: No results found.  Scheduled  Meds:  atenolol  100 mg Oral QPM   Chlorhexidine Gluconate Cloth  6 each Topical Daily   finasteride  5 mg Oral Daily   levothyroxine  100 mcg Oral QAC breakfast   sodium chloride flush  3 mL Intravenous Q12H   tamsulosin  0.4 mg Oral Daily   Continuous Infusions:  sodium chloride 75 mL/hr at 09/25/22 0950     LOS: 2 days   Hughie Closs, MD [facility]  09/25/2022, 10:36 AM   *Please note that this is a verbal dictation therefore any spelling or grammatical errors are due to the "Dragon Medical One" system interpretation.  Please page via Amion and do not message via secure chat for urgent patient care matters. Secure chat can be used for non urgent patient care matters.  How to contact the [facility] Attending or Consulting provider 7A - 7P or covering provider during after hours 7P -7A, for this patient?  Check the care team in [facility] and look for a) attending/consulting TRH provider listed and b) the [facility] team listed. Page or secure chat 7A-7P. Log into [email or web address] and use [place]'s universal password to access. If you do not have the password, please contact the hospital operator. Locate the [facility] provider you are looking for under [facility] and page to a number that you can be directly reached. If you still have difficulty reaching the provider, please page the [facility] (Director on Call) for the Hospitalists listed on amion for assistance.

## 2022-09-26 LAB — BASIC METABOLIC PANEL
Anion gap: 8 (ref 5–15)
BUN: 61 mg/dL — ABNORMAL HIGH (ref 8–23)
CO2: 18 mmol/L — ABNORMAL LOW (ref 22–32)
Calcium: 8.7 mg/dL — ABNORMAL LOW (ref 8.9–10.3)
Chloride: 114 mmol/L — ABNORMAL HIGH (ref 98–111)
Creatinine, Ser: 4.6 mg/dL — ABNORMAL HIGH (ref 0.61–1.24)
GFR, Estimated: 13 mL/min — ABNORMAL LOW (ref >60)
Glucose, Bld: 86 mg/dL (ref 70–99)
Potassium: 4.3 mmol/L (ref 3.5–5.1)
Sodium: 140 mmol/L (ref 135–145)

## 2022-09-26 NOTE — Progress Notes (Signed)
PROGRESS NOTE    Thomas Beard  ZJQ:[id number] DOB: 1950-10-05 DOA: 09/22/2022 PCP: Patient, No Pcp Per   Brief Narrative:  HPI: Thomas Beard is a 71 y.o. male with medical history significant for HTN, hypothyroidism, BPH with history of urinary retention who presented to the ED for evaluation of urinary retention with abnormal outpatient labs.   Patient reports chronic issues with urine output due to enlarged prostate.  He was previously taking Proscar and Flomax but self discontinued these per his preference.  He had follow-up with his PCP recently where labs were obtained.  He was told that his renal function was worse and advised to come to the ED for further evaluation.   ED Course  Labs/Imaging on admission: I have personally reviewed following labs and imaging studies.   Initial vitals showed BP 193/110, pulse 58, RR 16, temp 97.2 F, SpO2 99% on room air.   Labs show potassium 6.3, bicarb 16, BUN 99, creatinine 9.92, sodium 143, serum glucose 93, LFTs within normal limits.  WBC 5.5, hemoglobin 10.9, platelets 219,000.   Urinalysis shows 30 protein, negative nitrates, trace leukocytes, 0-5 RBCs, 6-10 WBCs, rare bacteria on microscopy.   CT renal stone study shows marked bilateral hydronephrosis and bilateral hydroureter's.  No demonstratable opaque renal or ureteral stones.  Diffuse wall thickening and marked enlargement of prostate noted.  8 mm nodular density in the right middle lobe noted.   CT head without contrast negative for acute changes.   Patient was given IV Lasix 40 mg, 5 units NovoLog with D50, Lokelma, 15 mg albuterol nebulizer, 1 g IV calcium gluconate, sodium bicarbonate infusion.  Repeat potassium was 5.0.  Foley catheter placed with reported 2500 cc urine output.  The hospitalist service was consulted to admit for further evaluation and management.  Assessment & Plan:   Principal Problem:   Acute renal failure due to urinary obstruction  [facility]) Active Problems:   Essential hypertension   Hypothyroidism   Hyperkalemia   BPH with urinary obstruction   Lung nodule seen on imaging study   AKI (acute kidney injury) (HCC)  Acute renal failure due to urinary obstruction (HCC) BPH with bladder outlet obstruction Creatinine 9.9 on admission (previously 0.69 in May 2022).  Acute renal failure is due to urinary retention from bladder outlet obstruction.  Foley placed in ED with at least 2500 mL output recorded.  He is net -36 L now.  Creatinine continues to improve but slower than anticipated.  Will continue to monitor and continue Proscar and Flomax.    Essential hypertension: Blood pressure controlled. Continue atenolol.  IV hydralazine as needed.   Lung nodule seen on imaging study 8 mm nodular density in the right middle lobe was seen incidentally on CT renal stone study.  Follow-up CT in 6 months may be considered.   Hyperkalemia Improved from 6.3 to 5.0 after initial treatment in the ED.     Hypothyroidism Continue Synthroid.   DVT prophylaxis: SCDs Start: 09/22/22 1927   Code Status: Full Code  Family Communication: Daughter present at bedside.  Plan of care discussed with patient in length and he/she verbalized understanding and agreed with it.  Status is: Inpatient Remains inpatient appropriate because: Creatinine is still significantly elevated but improving.    Estimated body mass index is 25.09 kg/m as calculated from the following:   Height as of this encounter: 5\' 8"  (1.727 m).   Weight as of this encounter: 74.8 kg.    Nutritional Assessment: Body mass  index is 25.09 kg/m.Marland Kitchen Seen by dietician.  I agree with the assessment and plan as outlined below: Nutrition Status:        . Skin Assessment: I have examined the patient's skin and I agree with the wound assessment as performed by the wound care RN as outlined below:    Consultants:  None  Procedures:  None  Antimicrobials:   Anti-infectives (From admission, onward)    Start     Dose/Rate Route Frequency Ordered Stop   09/22/22 1830  cefTRIAXone (ROCEPHIN) 1 g in sodium chloride 0.9 % 100 mL IVPB        1 g 200 mL/hr over 30 Minutes Intravenous STAT 09/22/22 1826 09/22/22 2035         Subjective:  Seen and examined.  He has no complaints.  Objective: Vitals:   09/25/22 1918 09/25/22 2324 09/25/22 2330 09/26/22 0500  BP:  (!) 142/89  128/83  Pulse:  (!) 57    Resp:  13    Temp: 98 F (36.7 C) 98 F (36.7 C)  98 F (36.7 C)  TempSrc: Oral Oral  Oral  SpO2:      Weight:   74.8 kg   Height:  5\' 8"  (1.727 m)      Intake/Output Summary (Last 24 hours) at 09/26/2022 1107 Last data filed at 09/26/2022 0500 Gross per 24 hour  Intake 855.88 ml  Output 4900 ml  Net -4044.12 ml    Filed Weights   09/25/22 2330  Weight: 74.8 kg    Examination:  General exam: Appears calm and comfortable  Respiratory system: Clear to auscultation. Respiratory effort normal. Cardiovascular system: S1 & S2 heard, RRR. No JVD, murmurs, rubs, gallops or clicks. No pedal edema. Gastrointestinal system: Abdomen is nondistended, soft and nontender. No organomegaly or masses felt. Normal bowel sounds heard. Central nervous system: Alert and oriented. No focal neurological deficits. Extremities: Symmetric 5 x 5 power. Skin: No rashes, lesions or ulcers.  Psychiatry: Judgement and insight appear normal. Mood & affect appropriate.   Data Reviewed: I have personally reviewed following labs and imaging studies  CBC: Recent Labs  Lab 09/22/22 1251 09/23/22 0407  WBC 5.5 6.2  NEUTROABS 3.9  --   HGB 10.9* 10.4*  HCT 32.5* 30.0*  MCV 94.8 91.2  PLT 219 209    Basic Metabolic Panel: Recent Labs  Lab 09/22/22 1705 09/23/22 0407 09/24/22 0304 09/25/22 0228 09/26/22 0720  NA 143 142 143 143 140  K 5.0 4.8 4.4 4.1 4.3  CL 118* 112* 113* 116* 114*  CO2 16* 18* 18* 18* 18*  GLUCOSE 102* 139* 99 97 86  BUN  97* 92* 82* 68* 61*  CREATININE 9.32* 8.62* 7.75* 5.79* 4.60*  CALCIUM 9.3 8.6* 7.9* 8.2* 8.7*    GFR: Estimated Creatinine Clearance: 14.3 mL/min (A) (by C-G formula based on SCr of 4.6 mg/dL (H)). Liver Function Tests: Recent Labs  Lab 09/22/22 1251  AST 15  ALT 21  ALKPHOS 61  BILITOT 0.6  PROT 7.1  ALBUMIN 4.0    No results for input(s): "LIPASE", "AMYLASE" in the last 168 hours. No results for input(s): "AMMONIA" in the last 168 hours. Coagulation Profile: No results for input(s): "INR", "PROTIME" in the last 168 hours. Cardiac Enzymes: No results for input(s): "CKTOTAL", "CKMB", "CKMBINDEX", "TROPONINI" in the last 168 hours. BNP (last 3 results) No results for input(s): "PROBNP" in the last 8760 hours. HbA1C: No results for input(s): "HGBA1C" in the last 72 hours. CBG: Recent  Labs  Lab 09/22/22 1529 09/22/22 1659  GLUCAP 83 99    Lipid Profile: No results for input(s): "CHOL", "HDL", "LDLCALC", "TRIG", "CHOLHDL", "LDLDIRECT" in the last 72 hours. Thyroid Function Tests: No results for input(s): "TSH", "T4TOTAL", "FREET4", "T3FREE", "THYROIDAB" in the last 72 hours. Anemia Panel: No results for input(s): "VITAMINB12", "FOLATE", "FERRITIN", "TIBC", "IRON", "RETICCTPCT" in the last 72 hours. Sepsis Labs: No results for input(s): "PROCALCITON", "LATICACIDVEN" in the last 168 hours.  Recent Results (from the past 240 hour(s))  Urine Culture     Status: None   Collection Time: 09/22/22  1:40 PM   Specimen: Urine, Clean Catch  Result Value Ref Range Status   Specimen Description URINE, CLEAN CATCH  Final   Special Requests NONE  Final   Culture   Final    NO GROWTH Performed at [facility], [address] [place], [place] [address]    Report Status 09/24/2022 FINAL  Final     Radiology Studies: No results found.  Scheduled Meds:  atenolol  100 mg Oral QPM   Chlorhexidine Gluconate Cloth  6 each Topical Daily   finasteride  5 mg Oral Daily    levothyroxine  100 mcg Oral QAC breakfast   sodium chloride flush  3 mL Intravenous Q12H   tamsulosin  0.4 mg Oral Daily   Continuous Infusions:  sodium chloride Stopped (09/25/22 1750)     LOS: 3 days   Hughie Closs, MD [facility]  09/26/2022, 11:07 AM   *Please note that this is a verbal dictation therefore any spelling or grammatical errors are due to the "Dragon Medical One" system interpretation.  Please page via Amion and do not message via secure chat for urgent patient care matters. Secure chat can be used for non urgent patient care matters.  How to contact the [facility] Attending or Consulting provider 7A - 7P or covering provider during after hours 7P -7A, for this patient?  Check the care team in [facility] and look for a) attending/consulting TRH provider listed and b) the [facility] team listed. Page or secure chat 7A-7P. Log into [email or web address] and use [place]'s universal password to access. If you do not have the password, please contact the hospital operator. Locate the [facility] provider you are looking for under [facility] and page to a number that you can be directly reached. If you still have difficulty reaching the provider, please page the [facility] (Director on Call) for the Hospitalists listed on amion for assistance.

## 2022-09-26 NOTE — Plan of Care (Signed)

## 2022-09-26 NOTE — Plan of Care (Signed)

## 2022-09-27 LAB — BASIC METABOLIC PANEL
Anion gap: 10 (ref 5–15)
BUN: 58 mg/dL — ABNORMAL HIGH (ref 8–23)
CO2: 18 mmol/L — ABNORMAL LOW (ref 22–32)
Calcium: 8.7 mg/dL — ABNORMAL LOW (ref 8.9–10.3)
Chloride: 113 mmol/L — ABNORMAL HIGH (ref 98–111)
Creatinine, Ser: 4.17 mg/dL — ABNORMAL HIGH (ref 0.61–1.24)
GFR, Estimated: 15 mL/min — ABNORMAL LOW (ref >60)
Glucose, Bld: 89 mg/dL (ref 70–99)
Potassium: 4.2 mmol/L (ref 3.5–5.1)
Sodium: 141 mmol/L (ref 135–145)

## 2022-09-27 MED ORDER — BISACODYL 10 MG RE SUPP
10.0000 mg | Freq: Once | RECTAL | Status: AC
  Administered 2022-09-27: 10 mg via RECTAL
  Filled 2022-09-27: qty 1

## 2022-09-27 MED ORDER — DOCUSATE SODIUM 100 MG PO CAPS
100.0000 mg | ORAL_CAPSULE | Freq: Two times a day (BID) | ORAL | Status: DC
  Administered 2022-09-27 – 2022-09-30 (×7): 100 mg via ORAL
  Filled 2022-09-27 (×5): qty 1

## 2022-09-27 MED ORDER — SODIUM CHLORIDE 0.9 % IV SOLN: INTRAVENOUS | Status: DC

## 2022-09-27 MED ORDER — POLYETHYLENE GLYCOL 3350 17 G PO PACK
17.0000 g | PACK | Freq: Every day | ORAL | Status: DC
  Administered 2022-09-27 – 2022-09-30 (×3): 17 g via ORAL
  Filled 2022-09-27 (×4): qty 1

## 2022-09-27 NOTE — Progress Notes (Signed)
PROGRESS NOTE    Thomas Beard  DJS:[id number] DOB: 1951/02/07 DOA: 09/22/2022 PCP: Patient, No Pcp Per   Brief Narrative:  HPI: Thomas Beard is a 71 y.o. male with medical history significant for HTN, hypothyroidism, BPH with history of urinary retention who presented to the ED for evaluation of urinary retention with abnormal outpatient labs.   Patient reports chronic issues with urine output due to enlarged prostate.  He was previously taking Proscar and Flomax but self discontinued these per his preference.  He had follow-up with his PCP recently where labs were obtained.  He was told that his renal function was worse and advised to come to the ED for further evaluation.   ED Course  Labs/Imaging on admission: I have personally reviewed following labs and imaging studies.   Initial vitals showed BP 193/110, pulse 58, RR 16, temp 97.2 F, SpO2 99% on room air.   Labs show potassium 6.3, bicarb 16, BUN 99, creatinine 9.92, sodium 143, serum glucose 93, LFTs within normal limits.  WBC 5.5, hemoglobin 10.9, platelets 219,000.   Urinalysis shows 30 protein, negative nitrates, trace leukocytes, 0-5 RBCs, 6-10 WBCs, rare bacteria on microscopy.   CT renal stone study shows marked bilateral hydronephrosis and bilateral hydroureter's.  No demonstratable opaque renal or ureteral stones.  Diffuse wall thickening and marked enlargement of prostate noted.  8 mm nodular density in the right middle lobe noted.   CT head without contrast negative for acute changes.   Patient was given IV Lasix 40 mg, 5 units NovoLog with D50, Lokelma, 15 mg albuterol nebulizer, 1 g IV calcium gluconate, sodium bicarbonate infusion.  Repeat potassium was 5.0.  Foley catheter placed with reported 2500 cc urine output.  The hospitalist service was consulted to admit for further evaluation and management.  Assessment & Plan:   Principal Problem:   Acute renal failure due to urinary obstruction  [facility]) Active Problems:   Essential hypertension   Hypothyroidism   Hyperkalemia   BPH with urinary obstruction   Lung nodule seen on imaging study   AKI (acute kidney injury) (HCC)  Acute renal failure due to urinary obstruction (HCC) BPH with bladder outlet obstruction Creatinine 9.9 on admission (previously 0.69 in May 2022).  Acute renal failure is due to urinary retention from bladder outlet obstruction.  Foley placed in ED with at least 2500 mL output recorded.  He is net -38 L now.  Creatinine continues to improve but slower than anticipated and down to 4.1 today, will resume IV fluids and continue Proscar and Flomax.    Essential hypertension: Blood pressure controlled. Continue atenolol.  IV hydralazine as needed.   Lung nodule seen on imaging study 8 mm nodular density in the right middle lobe was seen incidentally on CT renal stone study.  Follow-up CT in 6 months may be considered.   Hyperkalemia Improved from 6.3 to 5.0 after initial treatment in the ED.     Hypothyroidism Continue Synthroid.  Constipation: Will start on MiraLAX, Colace and Dulcolax suppository.  DVT prophylaxis: SCDs Start: 09/22/22 1927   Code Status: Full Code  Family Communication: Daughter present at bedside.  Plan of care discussed with patient in length and he/she verbalized understanding and agreed with it.  Status is: Inpatient Remains inpatient appropriate because: Creatinine is still significantly elevated but improving.    Estimated body mass index is 25.09 kg/m as calculated from the following:   Height as of this encounter: 5\' 8"  (1.727 m).   Weight  as of this encounter: 74.8 kg.    Nutritional Assessment: Body mass index is 25.09 kg/m.Marland Kitchen Seen by dietician.  I agree with the assessment and plan as outlined below: Nutrition Status:        . Skin Assessment: I have examined the patient's skin and I agree with the wound assessment as performed by the wound care RN as outlined  below:    Consultants:  None  Procedures:  None  Antimicrobials:  Anti-infectives (From admission, onward)    Start     Dose/Rate Route Frequency Ordered Stop   09/22/22 1830  cefTRIAXone (ROCEPHIN) 1 g in sodium chloride 0.9 % 100 mL IVPB        1 g 200 mL/hr over 30 Minutes Intravenous STAT 09/22/22 1826 09/22/22 2035         Subjective:  Patient seen and examined.  Daughter at the bedside.  Patient has no complaints other than constipation.  Objective: Vitals:   09/26/22 0710 09/26/22 1337 09/26/22 2011 09/27/22 0809  BP: 129/74 130/86 101/65 137/86  Pulse: 62 (!) 58 (!) 57 (!) 53  Resp: 17 16 16 16   Temp: 98.7 F (37.1 C) 98.6 F (37 C) 98.2 F (36.8 C) (!) 97.5 F (36.4 C)  TempSrc: Oral  Oral Oral  SpO2: 100% 96% 97% 97%  Weight:      Height:        Intake/Output Summary (Last 24 hours) at 09/27/2022 1039 Last data filed at 09/26/2022 1700 Gross per 24 hour  Intake 720 ml  Output 2950 ml  Net -2230 ml    Filed Weights   09/25/22 2330  Weight: 74.8 kg    Examination:  General exam: Appears calm and comfortable  Respiratory system: Clear to auscultation. Respiratory effort normal. Cardiovascular system: S1 & S2 heard, RRR. No JVD, murmurs, rubs, gallops or clicks. No pedal edema. Gastrointestinal system: Abdomen is nondistended, soft and nontender. No organomegaly or masses felt. Normal bowel sounds heard. Central nervous system: Alert and oriented. No focal neurological deficits. Extremities: Symmetric 5 x 5 power. Skin: No rashes, lesions or ulcers.  Psychiatry: Judgement and insight appear normal. Mood & affect appropriate.    Data Reviewed: I have personally reviewed following labs and imaging studies  CBC: Recent Labs  Lab 09/22/22 1251 09/23/22 0407  WBC 5.5 6.2  NEUTROABS 3.9  --   HGB 10.9* 10.4*  HCT 32.5* 30.0*  MCV 94.8 91.2  PLT 219 209    Basic Metabolic Panel: Recent Labs  Lab 09/23/22 0407 09/24/22 0304  09/25/22 0228 09/26/22 0720 09/27/22 0257  NA 142 143 143 140 141  K 4.8 4.4 4.1 4.3 4.2  CL 112* 113* 116* 114* 113*  CO2 18* 18* 18* 18* 18*  GLUCOSE 139* 99 97 86 89  BUN 92* 82* 68* 61* 58*  CREATININE 8.62* 7.75* 5.79* 4.60* 4.17*  CALCIUM 8.6* 7.9* 8.2* 8.7* 8.7*    GFR: Estimated Creatinine Clearance: 15.7 mL/min (A) (by C-G formula based on SCr of 4.17 mg/dL (H)). Liver Function Tests: Recent Labs  Lab 09/22/22 1251  AST 15  ALT 21  ALKPHOS 61  BILITOT 0.6  PROT 7.1  ALBUMIN 4.0    No results for input(s): "LIPASE", "AMYLASE" in the last 168 hours. No results for input(s): "AMMONIA" in the last 168 hours. Coagulation Profile: No results for input(s): "INR", "PROTIME" in the last 168 hours. Cardiac Enzymes: No results for input(s): "CKTOTAL", "CKMB", "CKMBINDEX", "TROPONINI" in the last 168 hours. BNP (last 3  results) No results for input(s): "PROBNP" in the last 8760 hours. HbA1C: No results for input(s): "HGBA1C" in the last 72 hours. CBG: Recent Labs  Lab 09/22/22 1529 09/22/22 1659  GLUCAP 83 99    Lipid Profile: No results for input(s): "CHOL", "HDL", "LDLCALC", "TRIG", "CHOLHDL", "LDLDIRECT" in the last 72 hours. Thyroid Function Tests: No results for input(s): "TSH", "T4TOTAL", "FREET4", "T3FREE", "THYROIDAB" in the last 72 hours. Anemia Panel: No results for input(s): "VITAMINB12", "FOLATE", "FERRITIN", "TIBC", "IRON", "RETICCTPCT" in the last 72 hours. Sepsis Labs: No results for input(s): "PROCALCITON", "LATICACIDVEN" in the last 168 hours.  Recent Results (from the past 240 hour(s))  Urine Culture     Status: None   Collection Time: 09/22/22  1:40 PM   Specimen: Urine, Clean Catch  Result Value Ref Range Status   Specimen Description URINE, CLEAN CATCH  Final   Special Requests NONE  Final   Culture   Final    NO GROWTH Performed at [facility], [address] [place], [place] [address]    Report Status 09/24/2022 FINAL   Final     Radiology Studies: No results found.  Scheduled Meds:  atenolol  100 mg Oral QPM   bisacodyl  10 mg Rectal Once   Chlorhexidine Gluconate Cloth  6 each Topical Daily   docusate sodium  100 mg Oral BID   finasteride  5 mg Oral Daily   levothyroxine  100 mcg Oral QAC breakfast   polyethylene glycol  17 g Oral Daily   sodium chloride flush  3 mL Intravenous Q12H   tamsulosin  0.4 mg Oral Daily   Continuous Infusions:  sodium chloride 100 mL/hr at 09/27/22 1012     LOS: 4 days   Hughie Closs, MD [facility]  09/27/2022, 10:39 AM   *Please note that this is a verbal dictation therefore any spelling or grammatical errors are due to the "Dragon Medical One" system interpretation.  Please page via Amion and do not message via secure chat for urgent patient care matters. Secure chat can be used for non urgent patient care matters.  How to contact the [facility] Attending or Consulting provider 7A - 7P or covering provider during after hours 7P -7A, for this patient?  Check the care team in [facility] and look for a) attending/consulting TRH provider listed and b) the [facility] team listed. Page or secure chat 7A-7P. Log into [email or web address] and use [place]'s universal password to access. If you do not have the password, please contact the hospital operator. Locate the [facility] provider you are looking for under [facility] and page to a number that you can be directly reached. If you still have difficulty reaching the provider, please page the [facility] (Director on Call) for the Hospitalists listed on amion for assistance.

## 2022-09-28 LAB — BASIC METABOLIC PANEL
Anion gap: 10 (ref 5–15)
BUN: 52 mg/dL — ABNORMAL HIGH (ref 8–23)
CO2: 18 mmol/L — ABNORMAL LOW (ref 22–32)
Calcium: 8.8 mg/dL — ABNORMAL LOW (ref 8.9–10.3)
Chloride: 113 mmol/L — ABNORMAL HIGH (ref 98–111)
Creatinine, Ser: 3.55 mg/dL — ABNORMAL HIGH (ref 0.61–1.24)
GFR, Estimated: 18 mL/min — ABNORMAL LOW (ref >60)
Glucose, Bld: 91 mg/dL (ref 70–99)
Potassium: 4.6 mmol/L (ref 3.5–5.1)
Sodium: 141 mmol/L (ref 135–145)

## 2022-09-28 NOTE — Progress Notes (Signed)
   09/28/22 1050  Clinical Encounter Type  Visited With Patient not available  Visit Type Initial;Other (Comment) (Adavanced Directive)  Referral From Nurse  Consult/Referral To Chaplain   Chaplain responded to a spiritual consult for advanced directive education.  The patient was resting. I will attempt another visit at a different time.   Valerie Roys [facility]  [phone]

## 2022-09-28 NOTE — Progress Notes (Signed)
PROGRESS NOTE    Thomas Beard  HQI:[id number] DOB: 1951-09-19 DOA: 09/22/2022 PCP: Patient, No Pcp Per   Brief Narrative:  HPI: Thomas Beard is a 71 y.o. male with medical history significant for HTN, hypothyroidism, BPH with history of urinary retention who presented to the ED for evaluation of urinary retention with abnormal outpatient labs.   Patient reports chronic issues with urine output due to enlarged prostate.  He was previously taking Proscar and Flomax but self discontinued these per his preference.  He had follow-up with his PCP recently where labs were obtained.  He was told that his renal function was worse and advised to come to the ED for further evaluation.   ED Course  Labs/Imaging on admission: I have personally reviewed following labs and imaging studies.   Initial vitals showed BP 193/110, pulse 58, RR 16, temp 97.2 F, SpO2 99% on room air.   Labs show potassium 6.3, bicarb 16, BUN 99, creatinine 9.92, sodium 143, serum glucose 93, LFTs within normal limits.  WBC 5.5, hemoglobin 10.9, platelets 219,000.   Urinalysis shows 30 protein, negative nitrates, trace leukocytes, 0-5 RBCs, 6-10 WBCs, rare bacteria on microscopy.   CT renal stone study shows marked bilateral hydronephrosis and bilateral hydroureter's.  No demonstratable opaque renal or ureteral stones.  Diffuse wall thickening and marked enlargement of prostate noted.  8 mm nodular density in the right middle lobe noted.   CT head without contrast negative for acute changes.   Patient was given IV Lasix 40 mg, 5 units NovoLog with D50, Lokelma, 15 mg albuterol nebulizer, 1 g IV calcium gluconate, sodium bicarbonate infusion.  Repeat potassium was 5.0.  Foley catheter placed with reported 2500 cc urine output.  The hospitalist service was consulted to admit for further evaluation and management.  Assessment & Plan:   Principal Problem:   Acute renal failure due to urinary obstruction  [facility]) Active Problems:   Essential hypertension   Hypothyroidism   Hyperkalemia   BPH with urinary obstruction   Lung nodule seen on imaging study   AKI (acute kidney injury) (HCC)  Acute renal failure due to urinary obstruction (HCC) BPH with bladder outlet obstruction Creatinine 9.9 on admission (previously 0.69 in May 2022).  Acute renal failure is due to urinary retention from bladder outlet obstruction.  Foley placed in ED with at least 2500 mL output recorded.  He is net -38 L now.  Creatinine continues to improve but slower than anticipated and down to 3.5 today, will continue IV fluids and continue Proscar and Flomax.    Essential hypertension: Blood pressure controlled. Continue atenolol.  IV hydralazine as needed.   Lung nodule seen on imaging study 8 mm nodular density in the right middle lobe was seen incidentally on CT renal stone study.  Follow-up CT in 6 months may be considered.   Hyperkalemia Improved from 6.3 to 5.0 after initial treatment in the ED.     Hypothyroidism Continue Synthroid.  Constipation: Will start on MiraLAX, Colace and Dulcolax suppository.  DVT prophylaxis: SCDs Start: 09/22/22 1927   Code Status: Full Code  Family Communication: Daughter present at bedside.  Plan of care discussed with patient in length and he/she verbalized understanding and agreed with it.  Status is: Inpatient Remains inpatient appropriate because: Creatinine is still significantly elevated but improving.    Estimated body mass index is 25.09 kg/m as calculated from the following:   Height as of this encounter: 5\' 8"  (1.727 m).   Weight  as of this encounter: 74.8 kg.    Nutritional Assessment: Body mass index is 25.09 kg/m.Marland Kitchen Seen by dietician.  I agree with the assessment and plan as outlined below: Nutrition Status:        . Skin Assessment: I have examined the patient's skin and I agree with the wound assessment as performed by the wound care RN as  outlined below:    Consultants:  None  Procedures:  None  Antimicrobials:  Anti-infectives (From admission, onward)    Start     Dose/Rate Route Frequency Ordered Stop   09/22/22 1830  cefTRIAXone (ROCEPHIN) 1 g in sodium chloride 0.9 % 100 mL IVPB        1 g 200 mL/hr over 30 Minutes Intravenous STAT 09/22/22 1826 09/22/22 2035         Subjective:  Seen and examined.  No complaints.  Objective: Vitals:   09/26/22 2011 09/27/22 0809 09/27/22 2020 09/28/22 0804  BP: 101/65 137/86 119/74 136/81  Pulse: (!) 57 (!) 53 61 (!) 56  Resp: 16 16 14 16   Temp: 98.2 F (36.8 C) (!) 97.5 F (36.4 C) 98 F (36.7 C) 98.3 F (36.8 C)  TempSrc: Oral Oral Oral Oral  SpO2: 97% 97% 97% 97%  Weight:      Height:        Intake/Output Summary (Last 24 hours) at 09/28/2022 0929 Last data filed at 09/28/2022 0631 Gross per 24 hour  Intake 1899.23 ml  Output 1950 ml  Net -50.77 ml    Filed Weights   09/25/22 2330  Weight: 74.8 kg    Examination:  General exam: Appears calm and comfortable  Respiratory system: Clear to auscultation. Respiratory effort normal. Cardiovascular system: S1 & S2 heard, RRR. No JVD, murmurs, rubs, gallops or clicks. No pedal edema. Gastrointestinal system: Abdomen is nondistended, soft and nontender. No organomegaly or masses felt. Normal bowel sounds heard. Central nervous system: Alert and oriented. No focal neurological deficits. Extremities: Symmetric 5 x 5 power. Skin: No rashes, lesions or ulcers.  Psychiatry: Judgement and insight appear normal. Mood & affect appropriate.   Data Reviewed: I have personally reviewed following labs and imaging studies  CBC: Recent Labs  Lab 09/22/22 1251 09/23/22 0407  WBC 5.5 6.2  NEUTROABS 3.9  --   HGB 10.9* 10.4*  HCT 32.5* 30.0*  MCV 94.8 91.2  PLT 219 209    Basic Metabolic Panel: Recent Labs  Lab 09/24/22 0304 09/25/22 0228 09/26/22 0720 09/27/22 0257 09/28/22 0420  NA 143 143 140  141 141  K 4.4 4.1 4.3 4.2 4.6  CL 113* 116* 114* 113* 113*  CO2 18* 18* 18* 18* 18*  GLUCOSE 99 97 86 89 91  BUN 82* 68* 61* 58* 52*  CREATININE 7.75* 5.79* 4.60* 4.17* 3.55*  CALCIUM 7.9* 8.2* 8.7* 8.7* 8.8*    GFR: Estimated Creatinine Clearance: 18.5 mL/min (A) (by C-G formula based on SCr of 3.55 mg/dL (H)). Liver Function Tests: Recent Labs  Lab 09/22/22 1251  AST 15  ALT 21  ALKPHOS 61  BILITOT 0.6  PROT 7.1  ALBUMIN 4.0    No results for input(s): "LIPASE", "AMYLASE" in the last 168 hours. No results for input(s): "AMMONIA" in the last 168 hours. Coagulation Profile: No results for input(s): "INR", "PROTIME" in the last 168 hours. Cardiac Enzymes: No results for input(s): "CKTOTAL", "CKMB", "CKMBINDEX", "TROPONINI" in the last 168 hours. BNP (last 3 results) No results for input(s): "PROBNP" in the last 8760 hours. HbA1C:  No results for input(s): "HGBA1C" in the last 72 hours. CBG: Recent Labs  Lab 09/22/22 1529 09/22/22 1659  GLUCAP 83 99    Lipid Profile: No results for input(s): "CHOL", "HDL", "LDLCALC", "TRIG", "CHOLHDL", "LDLDIRECT" in the last 72 hours. Thyroid Function Tests: No results for input(s): "TSH", "T4TOTAL", "FREET4", "T3FREE", "THYROIDAB" in the last 72 hours. Anemia Panel: No results for input(s): "VITAMINB12", "FOLATE", "FERRITIN", "TIBC", "IRON", "RETICCTPCT" in the last 72 hours. Sepsis Labs: No results for input(s): "PROCALCITON", "LATICACIDVEN" in the last 168 hours.  Recent Results (from the past 240 hour(s))  Urine Culture     Status: None   Collection Time: 09/22/22  1:40 PM   Specimen: Urine, Clean Catch  Result Value Ref Range Status   Specimen Description URINE, CLEAN CATCH  Final   Special Requests NONE  Final   Culture   Final    NO GROWTH Performed at [facility], [address] [place], [place] [address]    Report Status 09/24/2022 FINAL  Final     Radiology Studies: No results found.  Scheduled  Meds:  atenolol  100 mg Oral QPM   Chlorhexidine Gluconate Cloth  6 each Topical Daily   docusate sodium  100 mg Oral BID   finasteride  5 mg Oral Daily   levothyroxine  100 mcg Oral QAC breakfast   polyethylene glycol  17 g Oral Daily   sodium chloride flush  3 mL Intravenous Q12H   tamsulosin  0.4 mg Oral Daily   Continuous Infusions:  sodium chloride 100 mL/hr at 09/28/22 0807     LOS: 5 days   Hughie Closs, MD [facility]  09/28/2022, 9:29 AM   *Please note that this is a verbal dictation therefore any spelling or grammatical errors are due to the "Dragon Medical One" system interpretation.  Please page via Amion and do not message via secure chat for urgent patient care matters. Secure chat can be used for non urgent patient care matters.  How to contact the [facility] Attending or Consulting provider 7A - 7P or covering provider during after hours 7P -7A, for this patient?  Check the care team in [facility] and look for a) attending/consulting TRH provider listed and b) the [facility] team listed. Page or secure chat 7A-7P. Log into [email or web address] and use [place]'s universal password to access. If you do not have the password, please contact the hospital operator. Locate the [facility] provider you are looking for under [facility] and page to a number that you can be directly reached. If you still have difficulty reaching the provider, please page the [facility] (Director on Call) for the Hospitalists listed on amion for assistance.

## 2022-09-28 NOTE — TOC Initial Note (Addendum)
Transition of Care [facility]) - Initial/Assessment Note    Patient Details  Name: Thomas Beard MRN: [id number] Date of Birth: 1951/09/27  [facility]) CM/SW Contact:    Epifanio Lesches, RN Phone Number: 09/28/2022, 4:25 PM  Clinical Narrative:                 Admitted with acute renal failure due to urinary obstruction. PTA independent with ADL's , no DME usage. Senaida Ores( daughter) @ bedside states she is  here from [place] to assist with pt's care for 2-3 months once d/c.   Pt without noted PCP. NCM arranged post hospital f/u with [facility]:  [facility]   [phone] [place] [address]     Next Steps: Follow up on 10/13/2022 Instructions: Post hospital follow arranged for 10:30 am with Bertram Denver NP   Pt will probably need Match Letter for medication assistance @ d/c.  TOC team following for needs.....  Expected Discharge Plan: Home/Self Care Barriers to Discharge: Continued Medical Work up   Patient Goals and CMS Choice            Expected Discharge Plan and Services       Living arrangements for the past 2 months: Apartment                                      Prior Living Arrangements/Services Living arrangements for the past 2 months: Apartment Lives with:: Self Patient language and need for interpreter reviewed:: Yes Do you feel safe going back to the place where you live?: Yes      Need for Family Participation in Patient Care: Yes (Comment) Care giver support system in place?: Yes (comment)   Criminal Activity/Legal Involvement Pertinent to Current Situation/Hospitalization: No - Comment as needed  Activities of Daily Living Home Assistive Devices/Equipment: None ADL Screening (condition at time of admission) Patient's cognitive ability adequate to safely complete daily activities?: Yes Is the patient deaf or have difficulty hearing?: No Does the  patient have difficulty seeing, even when wearing glasses/contacts?: No Does the patient have difficulty concentrating, remembering, or making decisions?: No Patient able to express need for assistance with ADLs?: No Does the patient have difficulty dressing or bathing?: No Independently performs ADLs?: Yes (appropriate for developmental age) Does the patient have difficulty walking or climbing stairs?: No Weakness of Legs: None Weakness of Arms/Hands: None  Permission Sought/Granted   Permission granted to share information with : Yes, Verbal Permission Granted  Share Information with NAME: Paulla Fore ( daughter) [phone]           Emotional Assessment Appearance:: Appears stated age Attitude/Demeanor/Rapport: Engaged Affect (typically observed): Accepting Orientation: : Oriented to Self, Oriented to Place, Oriented to  Time, Oriented to Situation Alcohol / Substance Use: Not Applicable Psych Involvement: No (comment)  Admission diagnosis:  Lung nodule seen on imaging study [R91.1] Acute renal failure due to urinary obstruction (HCC) [N17.9, N13.9] AKI (acute kidney injury) (HCC) [N17.9] Patient Active Problem List   Diagnosis Date Noted   AKI (acute kidney injury) (HCC) 09/23/2022   Acute renal failure due to urinary obstruction (HCC) 09/22/2022   Essential hypertension 09/22/2022   Hypothyroidism 09/22/2022   Hyperkalemia 09/22/2022   BPH with urinary obstruction 09/22/2022   Lung nodule seen on imaging study 09/22/2022   PCP:  Patient, No Pcp Per Pharmacy:  [id number] [place], [place] - [address] AT [facility] OF [address] [place] [address] Phone: [phone] Fax: [phone]  [facility] - [place] - [address] [place] [address] Phone: [phone] Fax: [phone]     Social Determinants of Health (SDOH) Social  History: SDOH Screenings   Food Insecurity: No Food Insecurity (09/25/2022)  Housing: Low Risk  (09/25/2022)  Transportation Needs: No Transportation Needs (09/25/2022)  Utilities: Not At Risk (09/25/2022)  Tobacco Use: Low Risk  (09/22/2022)   SDOH Interventions: Housing Interventions: Intervention Not Indicated   Readmission Risk Interventions     No data to display

## 2022-09-29 LAB — BASIC METABOLIC PANEL
Anion gap: 8 (ref 5–15)
BUN: 43 mg/dL — ABNORMAL HIGH (ref 8–23)
CO2: 18 mmol/L — ABNORMAL LOW (ref 22–32)
Calcium: 8.7 mg/dL — ABNORMAL LOW (ref 8.9–10.3)
Chloride: 114 mmol/L — ABNORMAL HIGH (ref 98–111)
Creatinine, Ser: 3.17 mg/dL — ABNORMAL HIGH (ref 0.61–1.24)
GFR, Estimated: 20 mL/min — ABNORMAL LOW (ref >60)
Glucose, Bld: 87 mg/dL (ref 70–99)
Potassium: 4.7 mmol/L (ref 3.5–5.1)
Sodium: 140 mmol/L (ref 135–145)

## 2022-09-29 NOTE — Progress Notes (Signed)
Mobility Specialist Progress Note   09/29/22 1220  Mobility  Activity Ambulated independently in hallway  Level of Assistance Independent after set-up  Assistive Device None  Distance Ambulated (ft) 550 ft  Activity Response Tolerated well  $Mobility charge 1 Mobility   Received pt in bed having no complaints and agreeable to mobility. Pt was asymptomatic throughout ambulation and returned to room w/o fault. Left in room w/ call bell in reach and all needs met.  Holland Falling Mobility Specialist Please contact via SecureChat or  Rehab office at [phone]

## 2022-09-29 NOTE — Progress Notes (Signed)
PROGRESS NOTE    Thomas Beard  [id number] DOB: July 24, 1951 DOA: 09/22/2022 PCP: Patient, No Pcp Per   Brief Narrative:  HPI: Thomas Beard is a 71 y.o. male with medical history significant for HTN, hypothyroidism, BPH with history of urinary retention who presented to the ED for evaluation of urinary retention with abnormal outpatient labs.   Patient reports chronic issues with urine output due to enlarged prostate.  He was previously taking Proscar and Flomax but self discontinued these per his preference.  He had follow-up with his PCP recently where labs were obtained.  He was told that his renal function was worse and advised to come to the ED for further evaluation.   ED Course  Labs/Imaging on admission: I have personally reviewed following labs and imaging studies.   Initial vitals showed BP 193/110, pulse 58, RR 16, temp 97.2 F, SpO2 99% on room air.   Labs show potassium 6.3, bicarb 16, BUN 99, creatinine 9.92, sodium 143, serum glucose 93, LFTs within normal limits.  WBC 5.5, hemoglobin 10.9, platelets 219,000.   Urinalysis shows 30 protein, negative nitrates, trace leukocytes, 0-5 RBCs, 6-10 WBCs, rare bacteria on microscopy.   CT renal stone study shows marked bilateral hydronephrosis and bilateral hydroureter's.  No demonstratable opaque renal or ureteral stones.  Diffuse wall thickening and marked enlargement of prostate noted.  8 mm nodular density in the right middle lobe noted.   CT head without contrast negative for acute changes.   Patient was given IV Lasix 40 mg, 5 units NovoLog with D50, Lokelma, 15 mg albuterol nebulizer, 1 g IV calcium gluconate, sodium bicarbonate infusion.  Repeat potassium was 5.0.  Foley catheter placed with reported 2500 cc urine output.  The hospitalist service was consulted to admit for further evaluation and management.  Assessment & Plan:   Principal Problem:   Acute renal failure due to urinary obstruction  [facility]) Active Problems:   Essential hypertension   Hypothyroidism   Hyperkalemia   BPH with urinary obstruction   Lung nodule seen on imaging study   AKI (acute kidney injury) ([place])  Acute renal failure due to urinary obstruction (HCC) BPH with bladder outlet obstruction Creatinine 9.9 on admission (previously 0.69 in May 2022).  Acute renal failure is due to urinary retention from bladder outlet obstruction.  Foley placed in ED with at least 2500 mL output recorded.  He is net -38 L now.  Creatinine continues to improve but slower than anticipated and down to 3.17 today, will continue IV fluids and continue Proscar and Flomax.    Essential hypertension: Blood pressure controlled. Continue atenolol.  IV hydralazine as needed.   Lung nodule seen on imaging study 8 mm nodular density in the right middle lobe was seen incidentally on CT renal stone study.  Follow-up CT in 6 months may be considered.   Hyperkalemia Improved from 6.3 to 5.0 after initial treatment in the ED.     Hypothyroidism Continue Synthroid.  Constipation: Continue MiraLAX, Colace and Dulcolax suppository.  DVT prophylaxis: SCDs Start: 09/22/22 1927   Code Status: Full Code  Family Communication: Daughter present at bedside.  Plan of care discussed with patient in length and he/she verbalized understanding and agreed with it.  Status is: Inpatient Remains inpatient appropriate because: Creatinine is still significantly elevated but improving.    Estimated body mass index is 25.09 kg/m as calculated from the following:   Height as of this encounter: 5\' 8"  (1.727 m).   Weight as of  this encounter: 74.8 kg.    Nutritional Assessment: Body mass index is 25.09 kg/m.Marland Kitchen Seen by dietician.  I agree with the assessment and plan as outlined below: Nutrition Status:        . Skin Assessment: I have examined the patient's skin and I agree with the wound assessment as performed by the wound care RN as outlined  below:    Consultants:  None  Procedures:  None  Antimicrobials:  Anti-infectives (From admission, onward)    Start     Dose/Rate Route Frequency Ordered Stop   09/22/22 1830  cefTRIAXone (ROCEPHIN) 1 g in sodium chloride 0.9 % 100 mL IVPB        1 g 200 mL/hr over 30 Minutes Intravenous STAT 09/22/22 1826 09/22/22 2035         Subjective:  Seen and examined.  He has no complaints.  Daughter at the bedside.  Objective: Vitals:   09/28/22 0804 09/28/22 1447 09/28/22 1923 09/29/22 0725  BP: 136/81 108/61 98/63 137/86  Pulse: (!) 56 (!) 57 62 (!) 59  Resp: 16  16   Temp: 98.3 F (36.8 C) 98 F (36.7 C) 98.6 F (37 C) 97.8 F (36.6 C)  TempSrc: Oral Oral Oral Oral  SpO2: 97% 98% 97% 96%  Weight:      Height:        Intake/Output Summary (Last 24 hours) at 09/29/2022 0951 Last data filed at 09/29/2022 0518 Gross per 24 hour  Intake 2125.4 ml  Output 1350 ml  Net 775.4 ml    Filed Weights   09/25/22 2330  Weight: 74.8 kg    Examination:  General exam: Appears calm and comfortable  Respiratory system: Clear to auscultation. Respiratory effort normal. Cardiovascular system: S1 & S2 heard, RRR. No JVD, murmurs, rubs, gallops or clicks. No pedal edema. Gastrointestinal system: Abdomen is nondistended, soft and nontender. No organomegaly or masses felt. Normal bowel sounds heard. Central nervous system: Alert and oriented. No focal neurological deficits. Extremities: Symmetric 5 x 5 power. Skin: No rashes, lesions or ulcers.  Psychiatry: Judgement and insight appear normal. Mood & affect appropriate.   Data Reviewed: I have personally reviewed following labs and imaging studies  CBC: Recent Labs  Lab 09/22/22 1251 09/23/22 0407  WBC 5.5 6.2  NEUTROABS 3.9  --   HGB 10.9* 10.4*  HCT 32.5* 30.0*  MCV 94.8 91.2  PLT 219 209    Basic Metabolic Panel: Recent Labs  Lab 09/25/22 0228 09/26/22 0720 09/27/22 0257 09/28/22 0420 09/29/22 0238  NA 143  140 141 141 140  K 4.1 4.3 4.2 4.6 4.7  CL 116* 114* 113* 113* 114*  CO2 18* 18* 18* 18* 18*  GLUCOSE 97 86 89 91 87  BUN 68* 61* 58* 52* 43*  CREATININE 5.79* 4.60* 4.17* 3.55* 3.17*  CALCIUM 8.2* 8.7* 8.7* 8.8* 8.7*    GFR: Estimated Creatinine Clearance: 20.7 mL/min (A) (by C-G formula based on SCr of 3.17 mg/dL (H)). Liver Function Tests: Recent Labs  Lab 09/22/22 1251  AST 15  ALT 21  ALKPHOS 61  BILITOT 0.6  PROT 7.1  ALBUMIN 4.0    No results for input(s): "LIPASE", "AMYLASE" in the last 168 hours. No results for input(s): "AMMONIA" in the last 168 hours. Coagulation Profile: No results for input(s): "INR", "PROTIME" in the last 168 hours. Cardiac Enzymes: No results for input(s): "CKTOTAL", "CKMB", "CKMBINDEX", "TROPONINI" in the last 168 hours. BNP (last 3 results) No results for input(s): "PROBNP" in the  last 8760 hours. HbA1C: No results for input(s): "HGBA1C" in the last 72 hours. CBG: Recent Labs  Lab 09/22/22 1529 09/22/22 1659  GLUCAP 83 99    Lipid Profile: No results for input(s): "CHOL", "HDL", "LDLCALC", "TRIG", "CHOLHDL", "LDLDIRECT" in the last 72 hours. Thyroid Function Tests: No results for input(s): "TSH", "T4TOTAL", "FREET4", "T3FREE", "THYROIDAB" in the last 72 hours. Anemia Panel: No results for input(s): "VITAMINB12", "FOLATE", "FERRITIN", "TIBC", "IRON", "RETICCTPCT" in the last 72 hours. Sepsis Labs: No results for input(s): "PROCALCITON", "LATICACIDVEN" in the last 168 hours.  Recent Results (from the past 240 hour(s))  Urine Culture     Status: None   Collection Time: 09/22/22  1:40 PM   Specimen: Urine, Clean Catch  Result Value Ref Range Status   Specimen Description URINE, CLEAN CATCH  Final   Special Requests NONE  Final   Culture   Final    NO GROWTH Performed at [facility], [address] [place], [place] [address]    Report Status 09/24/2022 FINAL  Final     Radiology Studies: No results  found.  Scheduled Meds:  atenolol  100 mg Oral QPM   Chlorhexidine Gluconate Cloth  6 each Topical Daily   docusate sodium  100 mg Oral BID   finasteride  5 mg Oral Daily   levothyroxine  100 mcg Oral QAC breakfast   polyethylene glycol  17 g Oral Daily   sodium chloride flush  3 mL Intravenous Q12H   tamsulosin  0.4 mg Oral Daily   Continuous Infusions:  sodium chloride 100 mL/hr at 09/29/22 0518     LOS: 6 days   Darliss Cheney, MD [facility]  09/29/2022, 9:51 AM   *Please note that this is a verbal dictation therefore any spelling or grammatical errors are due to the "[employer] One" system interpretation.  Please page via [place] and do not message via secure chat for urgent patient care matters. Secure chat can be used for non urgent patient care matters.  How to contact the [facility] Attending or Consulting provider [place] or covering provider during after hours [place], for this patient?  Check the care team in [facility] and look for a) attending/consulting TRH provider listed and b) the [facility] team listed. Page or secure chat 7A-7P. Log into [email or web address] and use [place]'s universal password to access. If you do not have the password, please contact the hospital operator. Locate the [facility] provider you are looking for under [facility] and page to a number that you can be directly reached. If you still have difficulty reaching the provider, please page the [facility] (Director on Call) for the Hospitalists listed on amion for assistance.

## 2022-09-29 NOTE — Plan of Care (Signed)
  Problem: Education: Goal: Knowledge of General Education information will improve Description Including pain rating scale, medication(s)/side effects and non-pharmacologic comfort measures Outcome: Progressing   Problem: Health Behavior/Discharge Planning: Goal: Ability to manage health-related needs will improve Outcome: Progressing   Problem: Clinical Measurements: Goal: Ability to maintain clinical measurements within normal limits will improve Outcome: Progressing   Problem: Elimination: Goal: Will not experience complications related to urinary retention Outcome: Progressing   

## 2022-09-30 ENCOUNTER — Other Ambulatory Visit (HOSPITAL_COMMUNITY): Payer: Self-pay

## 2022-09-30 LAB — BASIC METABOLIC PANEL
Anion gap: 6 (ref 5–15)
BUN: 38 mg/dL — ABNORMAL HIGH (ref 8–23)
CO2: 19 mmol/L — ABNORMAL LOW (ref 22–32)
Calcium: 8.8 mg/dL — ABNORMAL LOW (ref 8.9–10.3)
Chloride: 115 mmol/L — ABNORMAL HIGH (ref 98–111)
Creatinine, Ser: 2.78 mg/dL — ABNORMAL HIGH (ref 0.61–1.24)
GFR, Estimated: 24 mL/min — ABNORMAL LOW (ref >60)
Glucose, Bld: 86 mg/dL (ref 70–99)
Potassium: 4.3 mmol/L (ref 3.5–5.1)
Sodium: 140 mmol/L (ref 135–145)

## 2022-09-30 MED ORDER — FINASTERIDE 5 MG PO TABS: 5.0000 mg | ORAL_TABLET | Freq: Every day | ORAL | 1 refills | Status: DC

## 2022-09-30 MED ORDER — TAMSULOSIN HCL 0.4 MG PO CAPS
0.4000 mg | ORAL_CAPSULE | Freq: Every day | ORAL | 0 refills | Status: DC
  Filled 2022-09-30: qty 30, 30d supply, fill #0

## 2022-09-30 NOTE — Discharge Summary (Signed)
Physician Discharge Summary  Thomas Beard ZOX:[id number] DOB: 08/17/1951 DOA: 09/22/2022  PCP: Patient, No Pcp Per  Admit date: 09/22/2022 Discharge date: 09/30/2022 30 Day Unplanned Readmission Risk Score    Flowsheet Row ED to Hosp-Admission (Current) [facility]  30 Day Unplanned Readmission Risk Score (%) 13.65 Filed at 09/30/2022 0801       This score is the patient's risk of an unplanned readmission within 30 days of being discharged (0 -100%). The score is based on dignosis, age, lab data, medications, orders, and past utilization.   Low:  0-14.9   Medium: 15-21.9   High: 22-29.9   Extreme: 30 and above          Admitted From: Home Disposition: Home  Recommendations for Outpatient Follow-up:  Follow up with PCP in 1-2 weeks Please obtain BMP/CBC in one week Follow-up with urology in 1 week Repeat CT chest for follow-up of lung nodule as mentioned below.  PCP to arrange outpatient CT chest. Please follow up with your PCP on the following pending results: Unresulted Labs (From admission, onward)    None         Home Health: None Equipment/Devices: None  Discharge Condition: Stable CODE STATUS: Full code Diet recommendation: Cardiac  Subjective: Seen and examined.  He has no complaints.  Daughter at the bedside.  Brief/Interim Summary:  Thomas Beard is a 71 y.o. male with medical history significant for HTN, hypothyroidism, BPH with history of urinary retention who presented to the ED for evaluation of urinary retention with abnormal outpatient labs.   Patient reports chronic issues with urine output due to enlarged prostate.  He was previously taking Proscar and Flomax but self discontinued these per his preference. He had follow-up with his PCP recently where labs were obtained.  He was told that his renal function was worse and advised to come to the ED for further evaluation.   Upon  arrival to ED, he was hemodynamically stable, Labs show potassium 6.3, bicarb 16, BUN 99, creatinine 9.92, sodium 143, serum glucose 93, LFTs within normal limits.  WBC 5.5, hemoglobin 10.9, platelets 219,000.   CT renal stone study shows marked bilateral hydronephrosis and bilateral hydroureter's.  No demonstratable opaque renal or ureteral stones.  Diffuse wall thickening and marked enlargement of prostate noted.  8 mm nodular density in the right middle lobe noted. CT head without contrast negative for acute changes.   Patient was given IV Lasix 40 mg, 5 units NovoLog with D50, Lokelma, 15 mg albuterol nebulizer, 1 g IV calcium gluconate, sodium bicarbonate infusion.  Repeat potassium was 5.0.  Foley catheter placed with reported 2500 cc urine output.  The hospitalist service was consulted to admit for further evaluation and management.   Acute renal failure due to urinary obstruction (HCC) BPH with bladder outlet obstruction Creatinine 9.9 on admission (previously 0.69 in May 2022).  Acute renal failure is due to urinary retention from bladder outlet obstruction.  Foley placed in ED with at least 2500 mL output recorded.  He is net -39 L now.  Creatinine continues to improve but slower than anticipated and down to 2.77 today, patient and his daughter are very eager for him to go home for last several days and now that we have several days of positive trend of improving creatinine, I think it would be safe to discharge patient today.  He will be discharged on Foley catheter with Proscar and Flomax and will follow-up  with urology.  Essential hypertension: Blood pressure controlled. Continue atenolol.    Lung nodule seen on imaging study 8 mm nodular density in the right middle lobe was seen incidentally on CT renal stone study.  Follow-up CT in 6 months may be considered.   Hyperkalemia Resolved.   Hypothyroidism Continue Synthroid.   Discharge plan was discussed with patient and/or family  member and they verbalized understanding and agreed with it.  Discharge Diagnoses:  Principal Problem:   Acute renal failure due to urinary obstruction [facility]) Active Problems:   Essential hypertension   Hypothyroidism   Hyperkalemia   BPH with urinary obstruction   Lung nodule seen on imaging study   AKI (acute kidney injury) [facility])    Discharge Instructions   Allergies as of 09/30/2022   No Known Allergies      Medication List     TAKE these medications    atenolol 100 MG tablet Commonly known as: TENORMIN Take 100 mg by mouth every evening.   finasteride 5 MG tablet Commonly known as: PROSCAR Take 1 tablet (5 mg total) by mouth daily.   levothyroxine 100 MCG tablet Commonly known as: SYNTHROID Take 100 mcg by mouth daily before breakfast.   tamsulosin 0.4 MG Caps capsule Commonly known as: Flomax Take 1 capsule (0.4 mg total) by mouth daily.        Follow-up Information      [facility] Follow up on 10/13/2022.   Why: Post hospital follow arranged for 10:30 am with Bertram Denver NP Contact information: [address] [place] [address] [phone]        PCP Follow up in 1 week(s).          [facility]. Call in 1 week(s).   Contact information: [address] [place] [address] [phone]               No Known Allergies  Consultations: None   Procedures/Studies: CT Renal Stone Study  Result Date: 09/22/2022 CLINICAL DATA:  Abdominal pain, flank pain EXAM: CT ABDOMEN AND PELVIS WITHOUT CONTRAST TECHNIQUE: Multidetector CT imaging of the abdomen and pelvis was performed following the standard protocol without IV contrast. RADIATION DOSE REDUCTION: This exam was performed according to the departmental dose-optimization program which includes automated exposure control, adjustment of the mA and/or kV according to patient size and/or use  of iterative reconstruction technique. COMPARISON:  CT done on 02/07/2021, sonogram done earlier today FINDINGS: Lower chest: Small linear patchy infiltrates are seen in the posterior aspects of both lower lung fields. In image 1 of series 5, there is 8 mm nodular density in right middle lobe. Small faint radiopacities seen in lingula and right middle lobe. Hepatobiliary: There are multiple calcified gallbladder stones. There is no dilation of bile ducts. Pancreas: No focal abnormalities are seen. Spleen: Unremarkable. Adrenals/Urinary Tract: Adrenals are unremarkable. There is severe hydronephrosis in both kidneys. Both ureters are dilated. There is diffuse wall thickening in urinary bladder. Foley catheter is seen in the bladder. There is minimal stranding in the fat planes adjacent to the urinary bladder. Stomach/Bowel: Stomach is unremarkable. Small bowel loops are not dilated. Appendix is not seen. There is no significant wall thickening in colon. Scattered diverticula are seen in colon. There is no evidence of focal acute diverticulitis. Vascular/Lymphatic: Scattered arterial calcifications are seen. Reproductive: There is marked enlargement of prostate. Other: There is no ascites or pneumoperitoneum. Small umbilical hernia containing  fat is seen. Right inguinal hernia containing fat is seen. Musculoskeletal: Degenerative changes are noted with encroachment of neural foramina at L4-L5 level, more so on the left side. IMPRESSION: There is no evidence of intestinal obstruction or pneumoperitoneum. There is marked bilateral hydronephrosis and bilateral hydroureters. There are no demonstrable opaque renal or ureteral stones. There is marked diffuse wall thickening in the urinary bladder which may be due to cystitis or chronic outlet obstruction. There is mild stranding in the fat planes adjacent to the urinary bladder. This may be due to chronic bladder outlet obstruction or cystitis. There is marked enlargement  of prostate. Gallbladder stones.  There are no signs of acute cholecystitis. Diverticulosis of colon without signs of focal diverticulitis. Lumbar spondylosis. There are small patchy infiltrates in both lower lung fields suggesting atelectasis/pneumonia. There is 8 mm nodular density in right middle lobe which may be part of pneumonia or parenchymal nodule such as granuloma or neoplasm. Follow-up CT in 6 months may be considered. Other findings as described in the body of the report. Electronically Signed   By: Ernie Avena M.D.   On: 09/22/2022 17:40   CT Head Wo Contrast  Result Date: 09/22/2022 CLINICAL DATA:  New onset headache EXAM: CT HEAD WITHOUT CONTRAST TECHNIQUE: Contiguous axial images were obtained from the base of the skull through the vertex without intravenous contrast. RADIATION DOSE REDUCTION: This exam was performed according to the departmental dose-optimization program which includes automated exposure control, adjustment of the mA and/or kV according to patient size and/or use of iterative reconstruction technique. COMPARISON:  None Available. FINDINGS: Brain: No evidence of acute infarction, hemorrhage, hydrocephalus, extra-axial collection or mass lesion/mass effect. Vascular: Negative for hyperdense vessel Skull: Negative Sinuses/Orbits: Mild mucosal edema maxillary sinus bilaterally. Remaining sinuses clear. Negative orbit Other: None IMPRESSION: Negative CT head. Electronically Signed   By: Marlan Palau M.D.   On: 09/22/2022 17:26   US Renal  Result Date: 09/22/2022 CLINICAL DATA:  Acute kidney injury EXAM: RENAL / URINARY TRACT ULTRASOUND COMPLETE COMPARISON:  CT abdomen and pelvis dated Feb 07, 2021 FINDINGS: Right Kidney: Renal measurements: 11.8 x 6.5 x 5.7 cm = volume: 231.1 mL. Echogenicity within normal limits. Severe hydronephrosis. Left Kidney: Renal measurements: 12.4 x 7.5 x 6.2 cm = volume: 303.1 mL. Echogenicity within normal limits. Severe hydronephrosis.  Multiple calculi are seen, largest measures 11 mm and is located in the interpolar region. Possible calculus of the proximal left ureter. Bladder: Distended urinary bladder with prevoid volume is 1,500 mL, postvoid images demonstrate no significant change in volume (reported as 1,352 mL, likely due to differences in measurement technique). Bilateral jets are visualized. Other: Prostatomegaly. IMPRESSION: 1. Severe bilateral hydronephrosis. 2. Possible calculus of the proximal left ureter. Recommend CT for further evaluation 3. Prostatomegaly with evidence of urinary retention. Electronically Signed   By: Allegra Lai M.D.   On: 09/22/2022 15:09   DG Chest 2 View  Result Date: 09/07/2022 CLINICAL DATA:  Left ventricular hypertrophy. EXAM: CHEST - 2 VIEW COMPARISON:  06/26/2013. FINDINGS: The heart size and mediastinal contours are within normal limits. Both lungs are clear. No acute osseous abnormality. IMPRESSION: No active cardiopulmonary disease. Electronically Signed   By: Thornell Sartorius M.D.   On: 09/07/2022 04:14     Discharge Exam: Vitals:   09/30/22 0353 09/30/22 0814  BP: 129/85 (!) 119/90  Pulse: 67 72  Resp: 17   Temp: 99.7 F (37.6 C) 98.3 F (36.8 C)  SpO2: 96% 95%   Vitals:  09/29/22 1831 09/29/22 2024 09/30/22 0353 09/30/22 0814  BP: 116/68 133/83 129/85 (!) 119/90  Pulse:  (!) 55 67 72  Resp:  17 17   Temp:  97.7 F (36.5 C) 99.7 F (37.6 C) 98.3 F (36.8 C)  TempSrc:   Oral Oral  SpO2:  97% 96% 95%  Weight:      Height:        General: Pt is alert, awake, not in acute distress Cardiovascular: RRR, S1/S2 +, no rubs, no gallops Respiratory: CTA bilaterally, no wheezing, no rhonchi Abdominal: Soft, NT, ND, bowel sounds + Extremities: no edema, no cyanosis    The results of significant diagnostics from this hospitalization (including imaging, microbiology, ancillary and laboratory) are listed below for reference.     Microbiology: Recent Results (from the  past 240 hour(s))  Urine Culture     Status: None   Collection Time: 09/22/22  1:40 PM   Specimen: Urine, Clean Catch  Result Value Ref Range Status   Specimen Description URINE, CLEAN CATCH  Final   Special Requests NONE  Final   Culture   Final    NO GROWTH Performed at [facility], [address], [place], [place] [address]    Report Status 09/24/2022 FINAL  Final     Labs: BNP (last 3 results) No results for input(s): "BNP" in the last 8760 hours. Basic Metabolic Panel: Recent Labs  Lab 09/26/22 0720 09/27/22 0257 09/28/22 0420 09/29/22 0238 09/30/22 0439  NA 140 141 141 140 140  K 4.3 4.2 4.6 4.7 4.3  CL 114* 113* 113* 114* 115*  CO2 18* 18* 18* 18* 19*  GLUCOSE 86 89 91 87 86  BUN 61* 58* 52* 43* 38*  CREATININE 4.60* 4.17* 3.55* 3.17* 2.78*  CALCIUM 8.7* 8.7* 8.8* 8.7* 8.8*   Liver Function Tests: No results for input(s): "AST", "ALT", "ALKPHOS", "BILITOT", "PROT", "ALBUMIN" in the last 168 hours. No results for input(s): "LIPASE", "AMYLASE" in the last 168 hours. No results for input(s): "AMMONIA" in the last 168 hours. CBC: No results for input(s): "WBC", "NEUTROABS", "HGB", "HCT", "MCV", "PLT" in the last 168 hours. Cardiac Enzymes: No results for input(s): "CKTOTAL", "CKMB", "CKMBINDEX", "TROPONINI" in the last 168 hours. BNP: Invalid input(s): "POCBNP" CBG: No results for input(s): "GLUCAP" in the last 168 hours. D-Dimer No results for input(s): "DDIMER" in the last 72 hours. Hgb A1c No results for input(s): "HGBA1C" in the last 72 hours. Lipid Profile No results for input(s): "CHOL", "HDL", "LDLCALC", "TRIG", "CHOLHDL", "LDLDIRECT" in the last 72 hours. Thyroid function studies No results for input(s): "TSH", "T4TOTAL", "T3FREE", "THYROIDAB" in the last 72 hours.  Invalid input(s): "FREET3" Anemia work up No results for input(s): "VITAMINB12", "FOLATE", "FERRITIN", "TIBC", "IRON", "RETICCTPCT" in the last 72 hours. Urinalysis     Component Value Date/Time   COLORURINE STRAW (A) 09/22/2022 1330   APPEARANCEUR CLEAR 09/22/2022 1330   LABSPEC 1.006 09/22/2022 1330   PHURINE 6.0 09/22/2022 1330   GLUCOSEU NEGATIVE 09/22/2022 1330   HGBUR NEGATIVE 09/22/2022 1330   BILIRUBINUR NEGATIVE 09/22/2022 1330   BILIRUBINUR neg 06/26/2013 0916   KETONESUR NEGATIVE 09/22/2022 1330   PROTEINUR 30 (A) 09/22/2022 1330   UROBILINOGEN 1.0 05/24/2014 1411   NITRITE NEGATIVE 09/22/2022 1330   LEUKOCYTESUR TRACE (A) 09/22/2022 1330   Sepsis Labs No results for input(s): "WBC" in the last 168 hours.  Invalid input(s): "PROCALCITONIN", "LACTICIDVEN" Microbiology Recent Results (from the past 240 hour(s))  Urine Culture     Status:  None   Collection Time: 09/22/22  1:40 PM   Specimen: Urine, Clean Catch  Result Value Ref Range Status   Specimen Description URINE, CLEAN CATCH  Final   Special Requests NONE  Final   Culture   Final    NO GROWTH Performed at [facility], [address], [place], [place] [address]    Report Status 09/24/2022 FINAL  Final     Time coordinating discharge: Over 30 minutes  SIGNED:   Hughie Closs, MD  [facility] 09/30/2022, 9:20 AM *Please note that this is a verbal dictation therefore any spelling or grammatical errors are due to the "Dragon [employer] One" system interpretation. If 7PM-7AM, please contact night-coverage [email or web address]

## 2022-09-30 NOTE — Plan of Care (Addendum)
Patient going home with foley and following up with urology in 1 week. Will educated family and patient. Converted to leg bag. Removed IV.   Problem: Education: Goal: Knowledge of General Education information will improve Description: Including pain rating scale, medication(s)/side effects and non-pharmacologic comfort measures Outcome: Progressing   Problem: Elimination: Goal: Will not experience complications related to bowel motility Outcome: Progressing   Problem: Pain Managment: Goal: General experience of comfort will improve Outcome: Progressing   Problem: Safety: Goal: Ability to remain free from injury will improve Outcome: Progressing   Problem: Skin Integrity: Goal: Risk for impaired skin integrity will decrease Outcome: Progressing

## 2022-09-30 NOTE — TOC Transition Note (Signed)
Transition of Care [facility]) - CM/SW Discharge Note   Patient Details  Name: Emauri Krygier MRN: [id number] Date of Birth: 1950/10/20  Transition of Care [facility]) CM/SW Contact:  Lockie Pares, RN Phone Number: 09/30/2022, 9:53 AM   Clinical Narrative:     Patient is going home with foley. Has daughter here to care for him. Will need to make appt with urology, PCP appt already made, on patient instructions. D/W nursing  teaching for daughter foley care.  Final next level of care: Home/Self Care Barriers to Discharge: No Barriers Identified   Patient Goals and CMS Choice      Discharge Placement               home          Discharge Plan and Services Additional resources added to the After Visit Summary for                                       Social Determinants of Health (SDOH) Interventions SDOH Screenings   Food Insecurity: No Food Insecurity (09/25/2022)  Housing: Low Risk  (09/25/2022)  Transportation Needs: No Transportation Needs (09/25/2022)  Utilities: Not At Risk (09/25/2022)  Tobacco Use: Low Risk  (09/22/2022)     Readmission Risk Interventions     No data to display

## 2022-09-30 NOTE — Plan of Care (Signed)
  Problem: Education: Goal: Knowledge of General Education information will improve Description: Including pain rating scale, medication(s)/side effects and non-pharmacologic comfort measures 09/30/2022 1149 by Darleene Cleaver, RN Outcome: Adequate for Discharge 09/30/2022 0949 by Darleene Cleaver, RN Outcome: Progressing   Problem: Health Behavior/Discharge Planning: Goal: Ability to manage health-related needs will improve Outcome: Adequate for Discharge   Problem: Clinical Measurements: Goal: Ability to maintain clinical measurements within normal limits will improve Outcome: Adequate for Discharge Goal: Will remain free from infection Outcome: Adequate for Discharge Goal: Diagnostic test results will improve Outcome: Adequate for Discharge Goal: Respiratory complications will improve Outcome: Adequate for Discharge Goal: Cardiovascular complication will be avoided Outcome: Adequate for Discharge   Problem: Activity: Goal: Risk for activity intolerance will decrease Outcome: Adequate for Discharge   Problem: Nutrition: Goal: Adequate nutrition will be maintained Outcome: Adequate for Discharge   Problem: Coping: Goal: Level of anxiety will decrease Outcome: Adequate for Discharge   Problem: Elimination: Goal: Will not experience complications related to bowel motility 09/30/2022 1149 by Darleene Cleaver, RN Outcome: Adequate for Discharge 09/30/2022 0949 by Darleene Cleaver, RN Outcome: Progressing Goal: Will not experience complications related to urinary retention Outcome: Adequate for Discharge   Problem: Pain Managment: Goal: General experience of comfort will improve 09/30/2022 1149 by Darleene Cleaver, RN Outcome: Adequate for Discharge 09/30/2022 0949 by Darleene Cleaver, RN Outcome: Progressing   Problem: Safety: Goal: Ability to remain free from injury will improve 09/30/2022 1149 by Darleene Cleaver, RN Outcome: Adequate for Discharge 09/30/2022  0949 by Darleene Cleaver, RN Outcome: Progressing   Problem: Skin Integrity: Goal: Risk for impaired skin integrity will decrease 09/30/2022 1149 by Darleene Cleaver, RN Outcome: Adequate for Discharge 09/30/2022 0949 by Darleene Cleaver, RN Outcome: Progressing

## 2022-10-08 ENCOUNTER — Other Ambulatory Visit: Payer: Self-pay

## 2022-10-08 ENCOUNTER — Encounter (HOSPITAL_COMMUNITY): Payer: Self-pay | Admitting: Emergency Medicine

## 2022-10-08 ENCOUNTER — Emergency Department (HOSPITAL_COMMUNITY)
Admission: EM | Admit: 2022-10-08 | Discharge: 2022-10-08 | Disposition: A | Discharge status: Home / Self Care | Payer: Self-pay | Attending: Emergency Medicine | Admitting: Emergency Medicine

## 2022-10-08 DIAGNOSIS — N3001 Acute cystitis with hematuria: Secondary | ICD-10-CM | POA: Insufficient documentation

## 2022-10-08 DIAGNOSIS — R339 Retention of urine, unspecified: Secondary | ICD-10-CM

## 2022-10-08 DIAGNOSIS — N184 Chronic kidney disease, stage 4 (severe): Secondary | ICD-10-CM | POA: Insufficient documentation

## 2022-10-08 LAB — I-STAT CHEM 8, ED
BUN: 42 mg/dL — ABNORMAL HIGH (ref 8–23)
Calcium, Ion: 1.1 mmol/L — ABNORMAL LOW (ref 1.15–1.40)
Chloride: 112 mmol/L — ABNORMAL HIGH (ref 98–111)
Creatinine, Ser: 2.8 mg/dL — ABNORMAL HIGH (ref 0.61–1.24)
Glucose, Bld: 150 mg/dL — ABNORMAL HIGH (ref 70–99)
HCT: 28 % — ABNORMAL LOW (ref 39.0–52.0)
Hemoglobin: 9.5 g/dL — ABNORMAL LOW (ref 13.0–17.0)
Potassium: 4.7 mmol/L (ref 3.5–5.1)
Sodium: 140 mmol/L (ref 135–145)
TCO2: 19 mmol/L — ABNORMAL LOW (ref 22–32)

## 2022-10-08 LAB — CBC WITH DIFFERENTIAL/PLATELET
Abs Immature Granulocytes: 0.04 10*3/uL (ref 0.00–0.07)
Basophils Absolute: 0.1 10*3/uL (ref 0.0–0.1)
Basophils Relative: 1 %
Eosinophils Absolute: 0.3 10*3/uL (ref 0.0–0.5)
Eosinophils Relative: 3 %
HCT: 33.9 % — ABNORMAL LOW (ref 39.0–52.0)
Hemoglobin: 11.1 g/dL — ABNORMAL LOW (ref 13.0–17.0)
Immature Granulocytes: 1 %
Lymphocytes Relative: 11 %
Lymphs Abs: 1 10*3/uL (ref 0.7–4.0)
MCH: 31.1 pg (ref 26.0–34.0)
MCHC: 32.7 g/dL (ref 30.0–36.0)
MCV: 95 fL (ref 80.0–100.0)
Monocytes Absolute: 0.8 10*3/uL (ref 0.1–1.0)
Monocytes Relative: 9 %
Neutro Abs: 6.8 10*3/uL (ref 1.7–7.7)
Neutrophils Relative %: 75 %
Platelets: 251 10*3/uL (ref 150–400)
RBC: 3.57 MIL/uL — ABNORMAL LOW (ref 4.22–5.81)
RDW: 13.5 % (ref 11.5–15.5)
WBC: 8.8 10*3/uL (ref 4.0–10.5)
nRBC: 0 % (ref 0.0–0.2)

## 2022-10-08 LAB — BASIC METABOLIC PANEL
Anion gap: 11 (ref 5–15)
BUN: 44 mg/dL — ABNORMAL HIGH (ref 8–23)
CO2: 16 mmol/L — ABNORMAL LOW (ref 22–32)
Calcium: 9.1 mg/dL (ref 8.9–10.3)
Chloride: 111 mmol/L (ref 98–111)
Creatinine, Ser: 2.56 mg/dL — ABNORMAL HIGH (ref 0.61–1.24)
GFR, Estimated: 26 mL/min — ABNORMAL LOW (ref >60)
Glucose, Bld: 91 mg/dL (ref 70–99)
Potassium: 5.1 mmol/L (ref 3.5–5.1)
Sodium: 138 mmol/L (ref 135–145)

## 2022-10-08 LAB — URINALYSIS, ROUTINE W REFLEX MICROSCOPIC
Bilirubin Urine: NEGATIVE
Glucose, UA: NEGATIVE mg/dL
Ketones, ur: NEGATIVE mg/dL
Nitrite: NEGATIVE
Protein, ur: 100 mg/dL — AB
RBC / HPF: >50 RBC/hpf — ABNORMAL HIGH (ref 0–5)
Specific Gravity, Urine: 1.013 (ref 1.005–1.030)
WBC, UA: >50 WBC/hpf — ABNORMAL HIGH (ref 0–5)
pH: 5 (ref 5.0–8.0)

## 2022-10-08 MED ORDER — CIPROFLOXACIN HCL 500 MG PO TABS: 500.0000 mg | ORAL_TABLET | Freq: Two times a day (BID) | ORAL | 0 refills | Status: DC

## 2022-10-08 MED ORDER — CIPROFLOXACIN HCL 500 MG PO TABS
500.0000 mg | ORAL_TABLET | Freq: Once | ORAL | Status: AC
  Administered 2022-10-08: 500 mg via ORAL
  Filled 2022-10-08: qty 1

## 2022-10-08 NOTE — ED Triage Notes (Signed)
Daughter stated, He had his catheter removed yesterday and told if he can not go on his own  to come back here. He emptied his bladder a hour ago via cathether cause he can not go by himself.

## 2022-10-08 NOTE — ED Provider Notes (Signed)
[facility] EMERGENCY DEPARTMENT Provider Note   CSN: [id number] Arrival date & time: 10/08/22  0865     History  Chief Complaint  Patient presents with   Thomas Beard retention    Kaiven Vester is a 72 y.o. male.  Pt is a 72 yo male with a pmhx significant for BPH and recent AKI due to urinary retention.  Pt was admitted from 12/20-12/28 for the AKI.  He was d/c with a foley catheter which was removed by urology yesterday.  Pt has been unable to urinate on his own since then.  He did a self cath this am around 0630.  Pt feels like his bladder is full again.       Home Medications Prior to Admission medications   Medication Sig Start Date End Date Taking? Authorizing Provider  ciprofloxacin (CIPRO) 500 MG tablet Take 1 tablet (500 mg total) by mouth 2 (two) times daily. 10/08/22  Yes Isla Pence, MD  atenolol (TENORMIN) 100 MG tablet Take 100 mg by mouth every evening. 09/06/22   [provider]  finasteride (PROSCAR) 5 MG tablet Take 1 tablet (5 mg total) by mouth daily. 09/30/22   Darliss Cheney, MD  levothyroxine (SYNTHROID) 100 MCG tablet Take 100 mcg by mouth daily before breakfast.    [provider]  tamsulosin (FLOMAX) 0.4 MG CAPS capsule Take 1 capsule (0.4 mg total) by mouth daily. 09/30/22   Darliss Cheney, MD      Allergies    Patient has no known allergies.    Review of Systems   Review of Systems  Genitourinary:  Positive for difficulty urinating.    Physical Exam Updated Vital Signs BP 101/62   Pulse 66   Temp 98.4 F (36.9 C)   Resp 16   SpO2 99%  Physical Exam Vitals and nursing note reviewed.  Constitutional:      Appearance: Normal appearance.  HENT:     Head: Normocephalic and atraumatic.     Right Ear: External ear normal.     Left Ear: External ear normal.     Nose: Nose normal.     Mouth/Throat:     Mouth: Mucous membranes are moist.     Pharynx: Oropharynx is clear.  Eyes:     Extraocular  Movements: Extraocular movements intact.     Conjunctiva/sclera: Conjunctivae normal.     Pupils: Pupils are equal, round, and reactive to light.  Cardiovascular:     Rate and Rhythm: Normal rate and regular rhythm.     Pulses: Normal pulses.     Heart sounds: Normal heart sounds.  Pulmonary:     Effort: Pulmonary effort is normal.     Breath sounds: Normal breath sounds.  Abdominal:     General: Abdomen is flat. Bowel sounds are normal.     Palpations: Abdomen is soft.     Tenderness: There is abdominal tenderness in the suprapubic area.  Musculoskeletal:        General: Normal range of motion.     Cervical back: Normal range of motion and neck supple.  Skin:    General: Skin is warm.     Capillary Refill: Capillary refill takes less than 2 seconds.  Neurological:     General: No focal deficit present.     Mental Status: He is alert and oriented to person, place, and time.  Psychiatric:        Mood and Affect: Mood normal.        Behavior: Behavior  normal.     ED Results / Procedures / Treatments   Labs (all labs ordered are listed, but only abnormal results are displayed) Labs Reviewed  URINALYSIS, ROUTINE W REFLEX MICROSCOPIC - Abnormal; Notable for the following components:      Result Value   APPearance CLOUDY (*)    Hgb urine dipstick LARGE (*)    Protein, ur 100 (*)    Leukocytes,Ua LARGE (*)    RBC / HPF >50 (*)    WBC, UA >50 (*)    Bacteria, UA FEW (*)    All other components within normal limits  CBC WITH DIFFERENTIAL/PLATELET - Abnormal; Notable for the following components:   RBC 3.57 (*)    Hemoglobin 11.1 (*)    HCT 33.9 (*)    All other components within normal limits  I-STAT CHEM 8, ED - Abnormal; Notable for the following components:   Chloride 112 (*)    BUN 42 (*)    Creatinine, Ser 2.80 (*)    Glucose, Bld 150 (*)    Calcium, Ion 1.10 (*)    TCO2 19 (*)    Hemoglobin 9.5 (*)    HCT 28.0 (*)    All other components within normal limits   URINE CULTURE  BASIC METABOLIC PANEL    EKG None  Radiology No results found.  Procedures Procedures    Medications Ordered in ED Medications  ciprofloxacin (CIPRO) tablet 500 mg (has no administration in time range)    ED Course/ Medical Decision Making/ A&P                           Medical Decision Making Amount and/or Complexity of Data Reviewed Labs: ordered.  Risk Prescription drug management.   This patient presents to the ED for concern of urinary retention, this involves an extensive number of treatment options, and is a complaint that carries with it a high risk of complications and morbidity.  The differential diagnosis includes bladder outlet obstruction, aki   Co morbidities that complicate the patient evaluation  bph   Additional history obtained:  Additional history obtained from epic chart review External records from outside source obtained and reviewed including daughter   Lab Tests:  I Ordered, and personally interpreted labs.  The pertinent results include:  cbc with hgb 11.1; istat with cr 2.8 (chronic); ua with uti   Cardiac Monitoring:  The patient was maintained on a cardiac monitor.  I personally viewed and interpreted the cardiac monitored which showed an underlying rhythm of: nsr   Medicines ordered and prescription drug management:  I ordered medication including cipro  for uti  Reevaluation of the patient after these medicines showed that the patient improved I have reviewed the patients home medicines and have made adjustments as needed   Critical Interventions:  foley   Problem List / ED Course:  Urinary retention:  foley placed CKD:  kidney function is stable Uti:  urine sent for culture.  Cipro started.   Reevaluation:  After the interventions noted above, I reevaluated the patient and found that they have :improved   Social Determinants of Health:  Spanish speaker   Dispostion:  After  consideration of the diagnostic results and the patients response to treatment, I feel that the patent would benefit from discharge with outpatient f/u.          Final Clinical Impression(s) / ED Diagnoses Final diagnoses:  Urinary retention  Acute cystitis with hematuria  CKD (chronic kidney disease) stage 4, GFR 15-29 ml/min (HCC)    Rx / DC Orders ED Discharge Orders          Ordered    ciprofloxacin (CIPRO) 500 MG tablet  2 times daily        10/08/22 1336              Isla Pence, MD 10/08/22 1338

## 2022-10-10 LAB — URINE CULTURE: Culture: 30000 — AB

## 2022-10-10 IMAGING — CT CT ABD-PELV W/ CM
2 of 5 series · 16 of 46 positions shown, 18 images · IV contrast (omnipaque)
Comparison: None.

CLINICAL DATA: Abdominal pain described as "pain in the hip of my
stomach".

EXAM:
CT ABDOMEN AND PELVIS WITH CONTRAST
TECHNIQUE: Multidetector CT imaging of the abdomen and pelvis was performed
using the standard protocol following bolus administration of
intravenous contrast.
CONTRAST:  100mL OMNIPAQUE IOHEXOL 300 MG/ML  SOLN

[Series 3: abdomen 5.0 · axial · 0.78mm/px · z∈[-514,-84]mm · 13 of 100 slices shown, 15 images]
[im 7/100  soft-tissue]
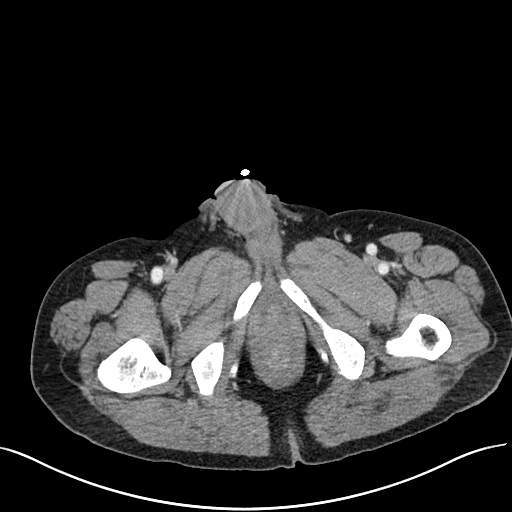
[im 7/100  bone]
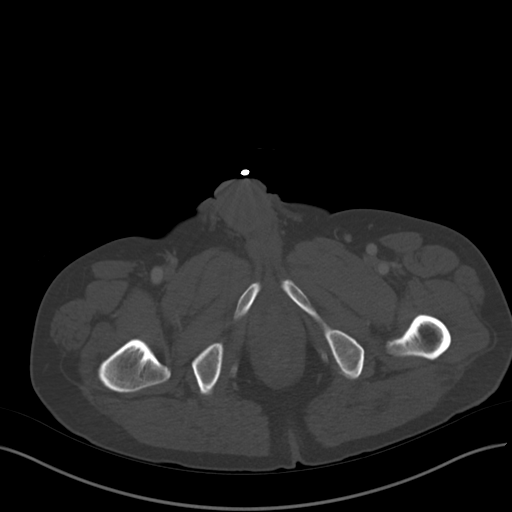
[im 13/100  soft-tissue]
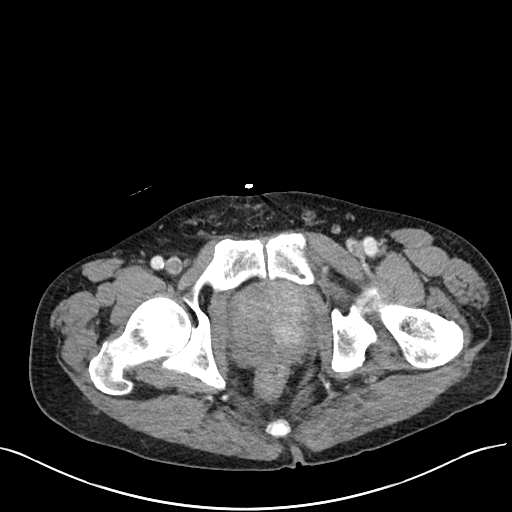
[im 19/100  soft-tissue]
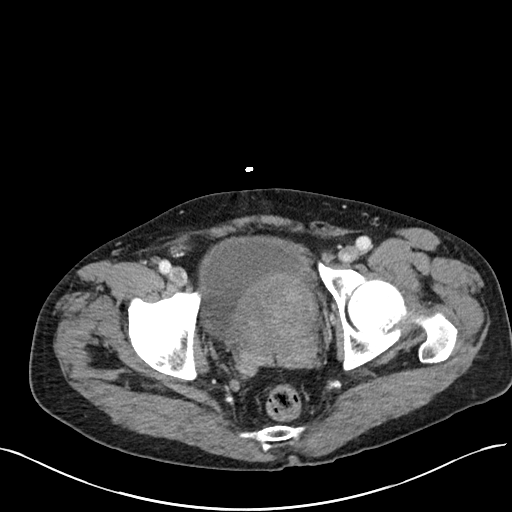
[im 31/100  soft-tissue]
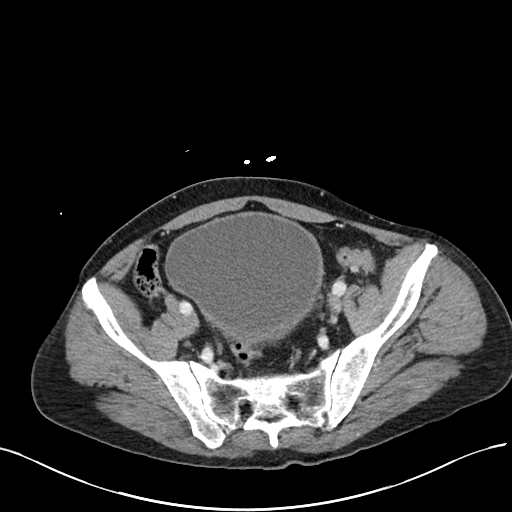
[im 38/100  soft-tissue]
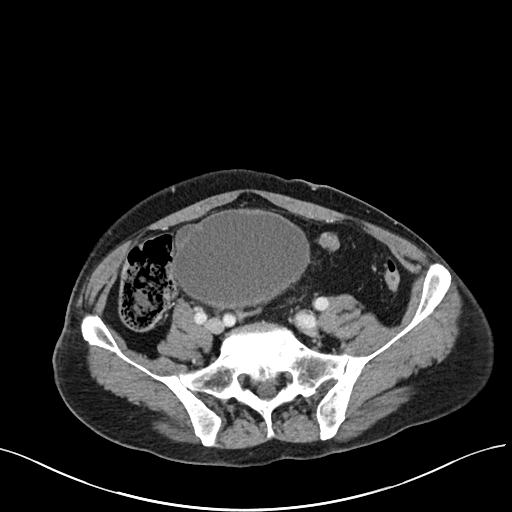
[im 44/100  soft-tissue]
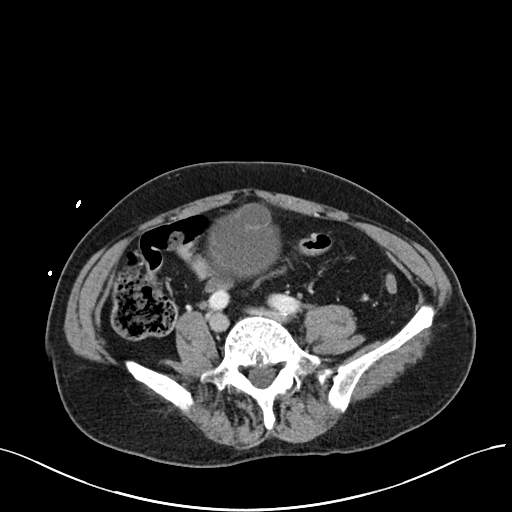
[im 50/100  soft-tissue]
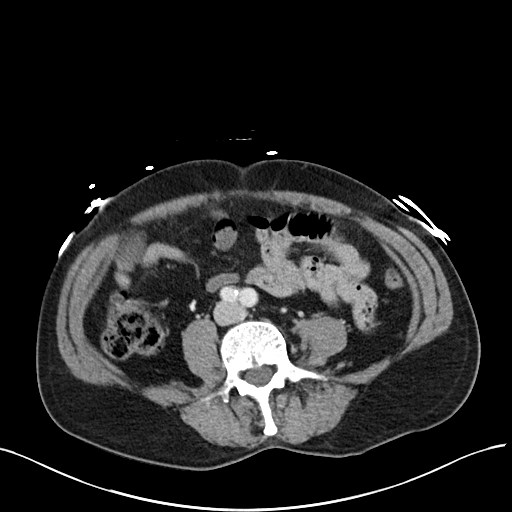
[im 56/100  soft-tissue]
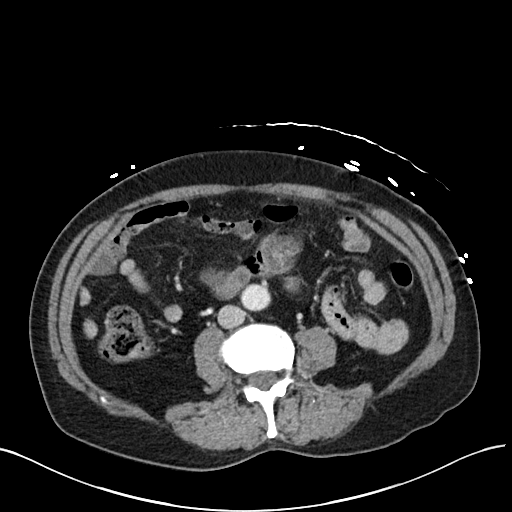
[im 62/100  soft-tissue]
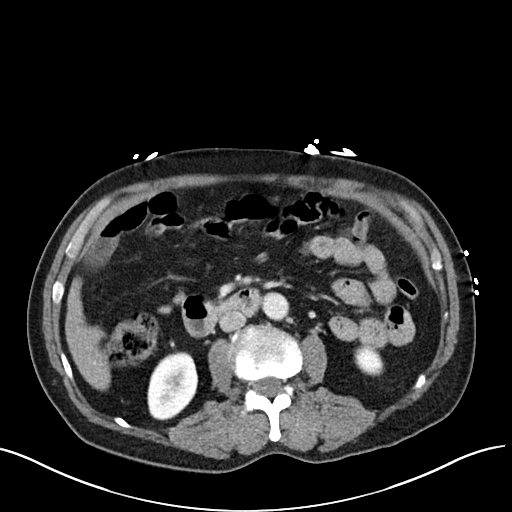
[im 62/100  bone]
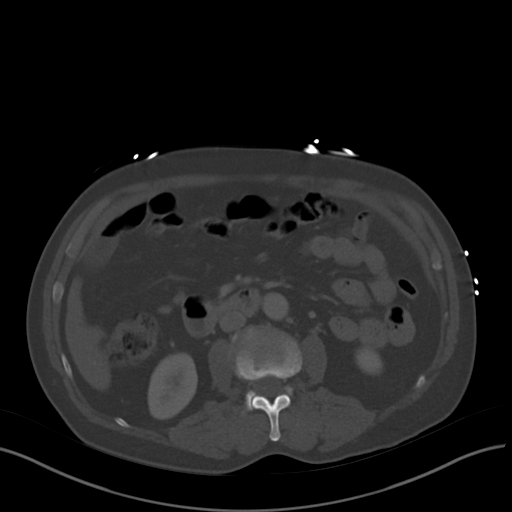
[im 69/100  soft-tissue]
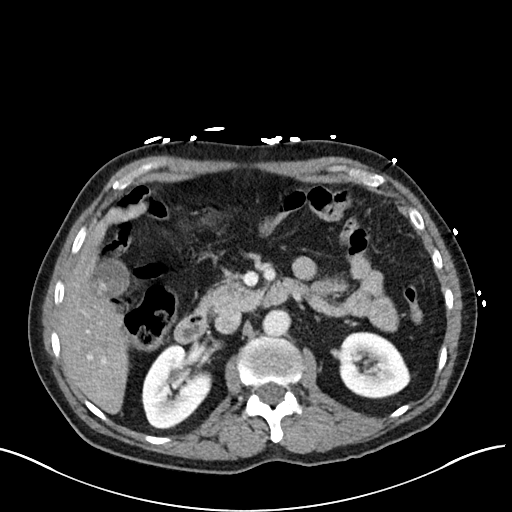
[im 81/100  soft-tissue]
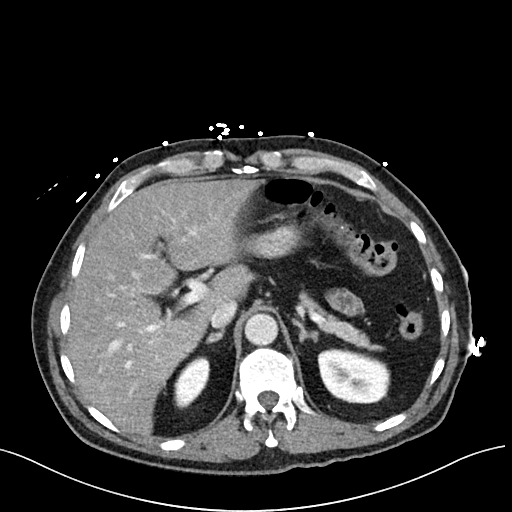
[im 87/100  soft-tissue]
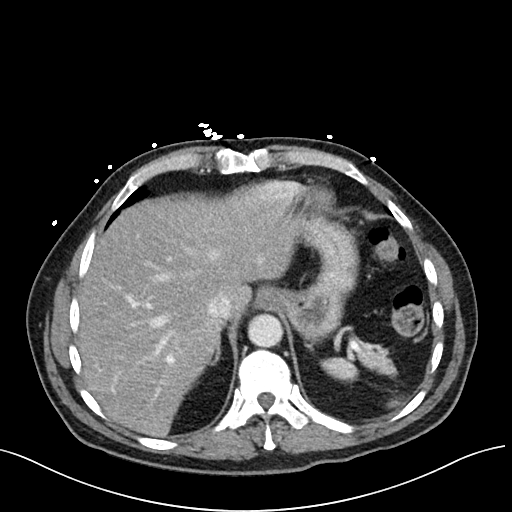
[im 93/100  soft-tissue]
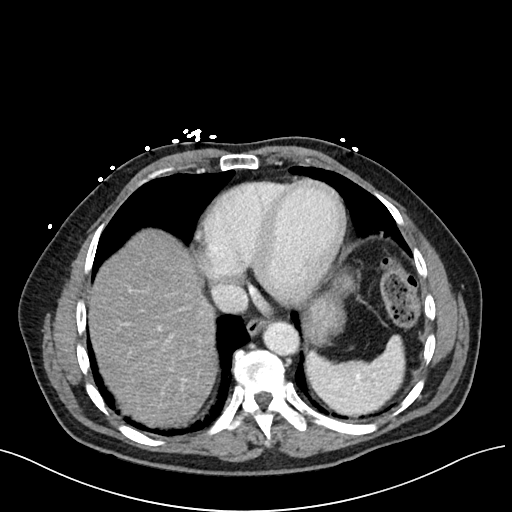

[Series 7: abdomen 3.0 mpr cor · coronal · 0.76mm/px · 3 of 92 slices shown]
[im 31/92  soft-tissue]
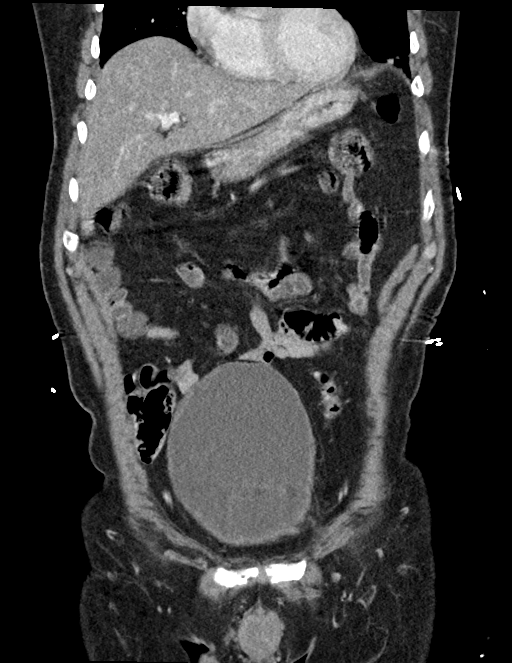
[im 41/92  soft-tissue]
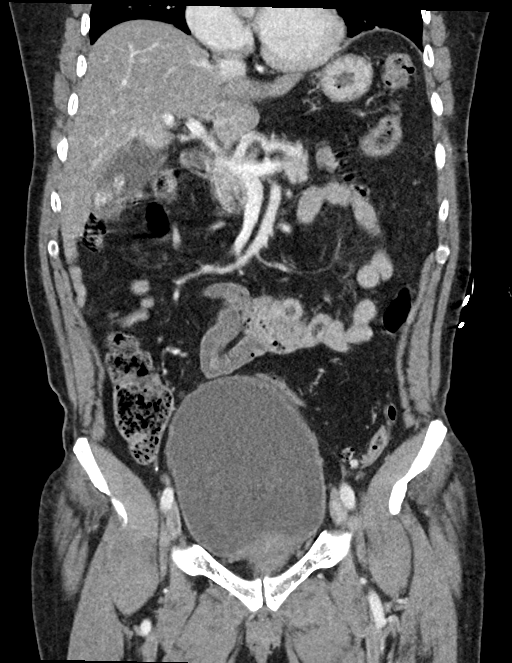
[im 51/92  soft-tissue]
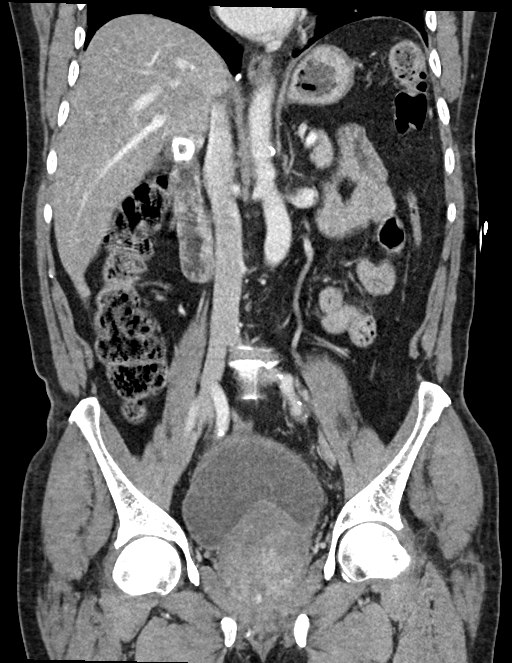

[16 of 46 positions shown; findings below may reference images not displayed]

FINDINGS: Lower chest: Dependent lung base opacities consistent with
atelectasis. No acute findings at the lung bases.

Hepatobiliary: Liver normal in size. Diffuse decreased attenuation
consistent with fatty infiltration. No mass. Several dependent
gallstones. No gallbladder wall thickening or adjacent inflammation.
No bile duct dilation.

Pancreas: Unremarkable. No pancreatic ductal dilatation or
surrounding inflammatory changes.

Spleen: Normal in size without focal abnormality.

Adrenals/Urinary Tract: No adrenal masses. Kidneys normal in size,
orientation and position with symmetric enhancement and excretion.
Subcentimeter low-density lesion, midpole the left kidney,
consistent with a cyst. No other masses, no stones and no
hydronephrosis. Normal ureters.

Bladder shows small diverticula/cellule and is moderately distended.
No mass or stone.

Stomach/Bowel: Normal stomach. Small bowel and colon are normal in
caliber. No wall thickening. No inflammation. Appendix not
visualized. No evidence of appendicitis.

Vascular/Lymphatic: Minimal aortic atherosclerosis. No aneurysm. No
enlarged lymph nodes.

Reproductive: Marked enlargement of the prostate measuring 8.3 x
x 7.4 cm. Prostate elevates the bladder base.

Other: No abdominal wall hernia or abnormality. No abdominopelvic
ascites.

Musculoskeletal: No fracture or acute finding.  No bone lesion.
IMPRESSION: 1. No acute findings. No findings to account for the patient's pain.
2. Marked prostate hypertrophy with findings consistent with chronic
bladder outlet obstruction.
3. Gallstones without evidence of acute cholecystitis.
4. Hepatic steatosis.
5. Minor aortic atherosclerosis.

## 2022-10-10 IMAGING — US US ABDOMEN LIMITED RUQ/ASCITES
1 series · 14 of 25 positions shown · non-contrast
Comparison: Abdomen and pelvis CT obtained earlier today.

CLINICAL DATA: Right upper quadrant abdominal pain. Gallstones seen
in the gallbladder on an abdomen and pelvis CT earlier today.

EXAM:
ULTRASOUND ABDOMEN LIMITED RIGHT UPPER QUADRANT

[Series 1: us abdomen limited ruq (liver/gb) · 53 acquisitions, 14 frames shown]
[im 1/53]
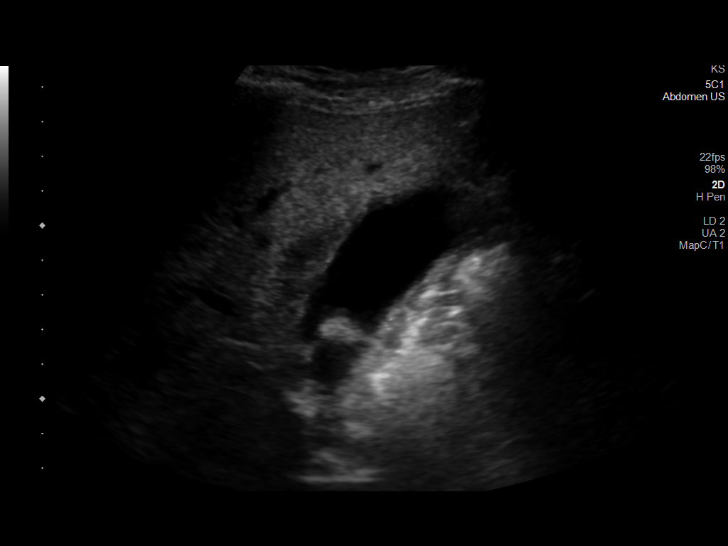
[im 5/53]
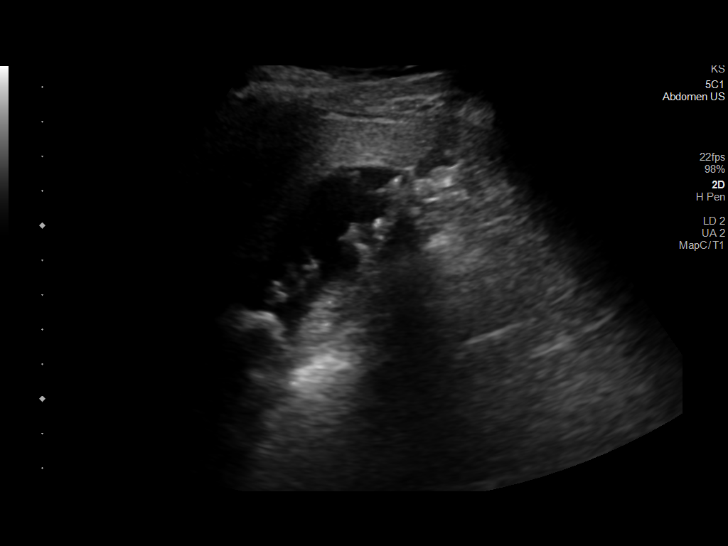
[im 9/53]
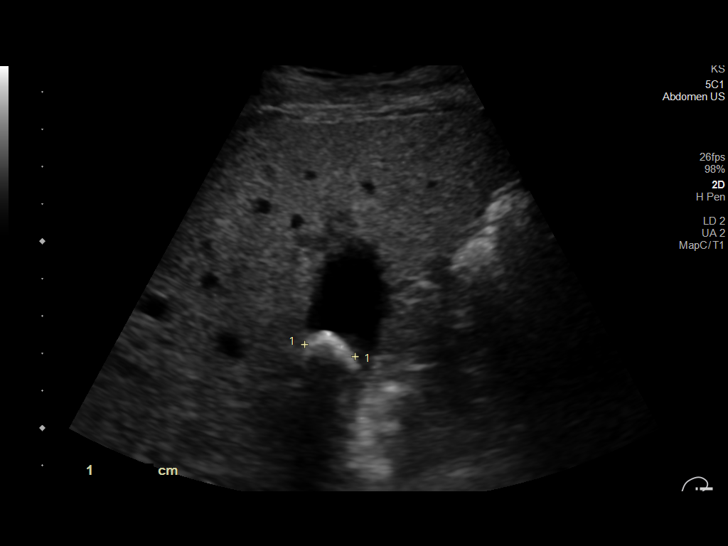
[im 14/53]
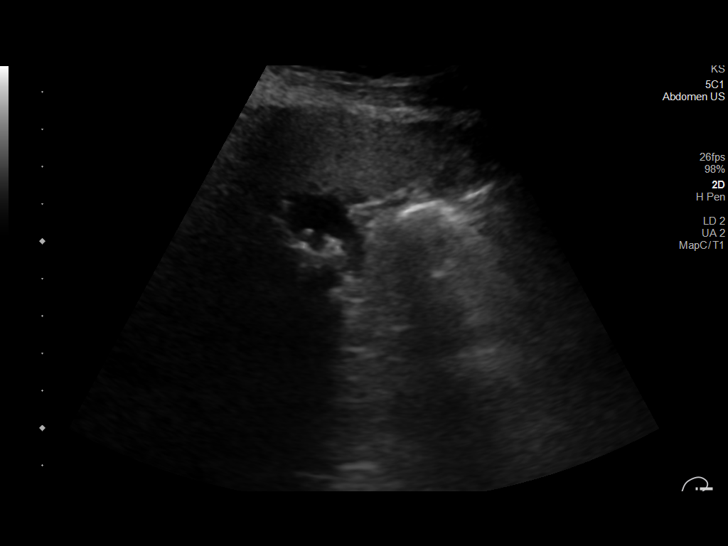
[im 18/53]
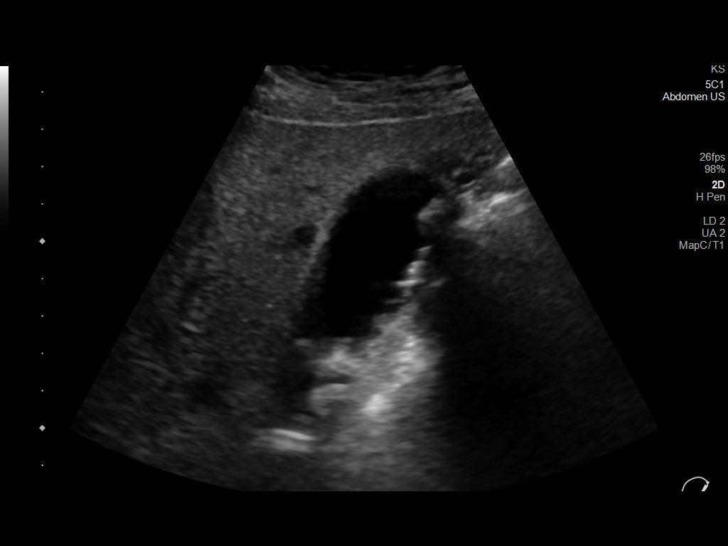
[im 20/53]
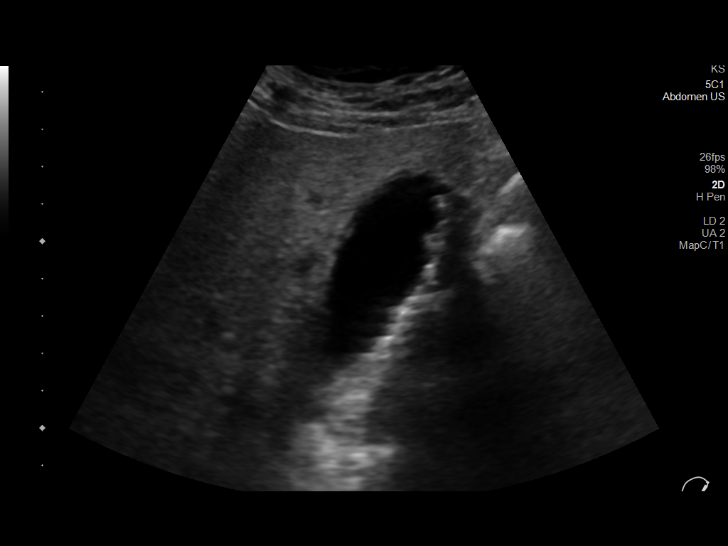
[im 24/53]
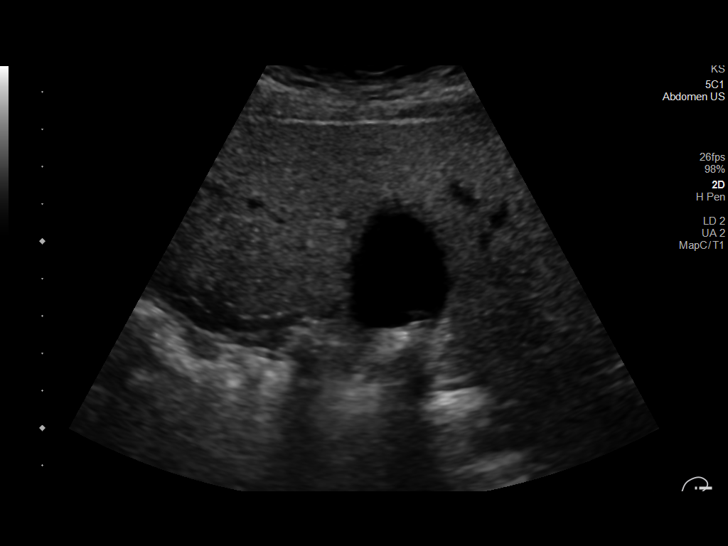
[im 29/53]
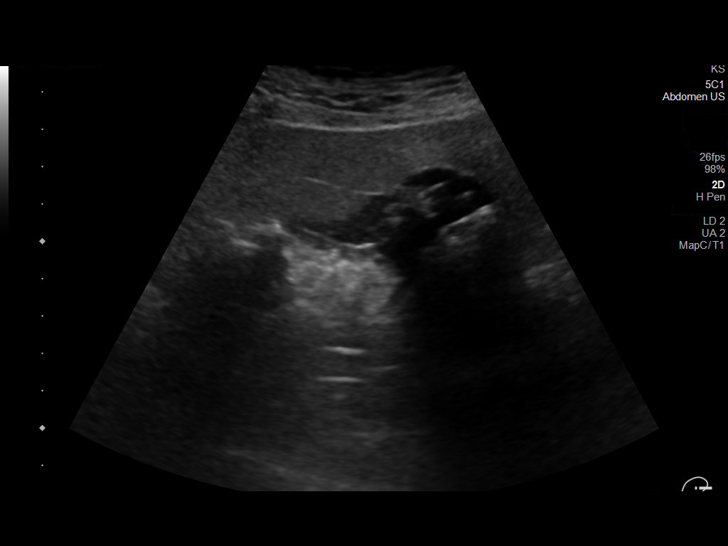
[im 33/53]
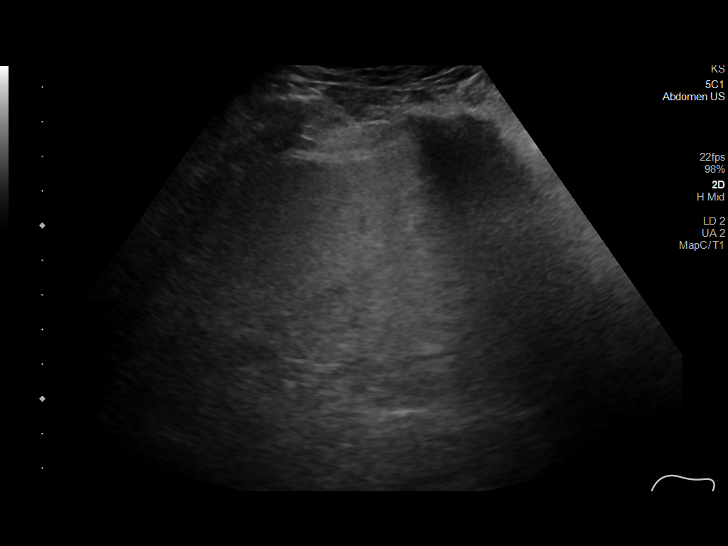
[im 35/53]
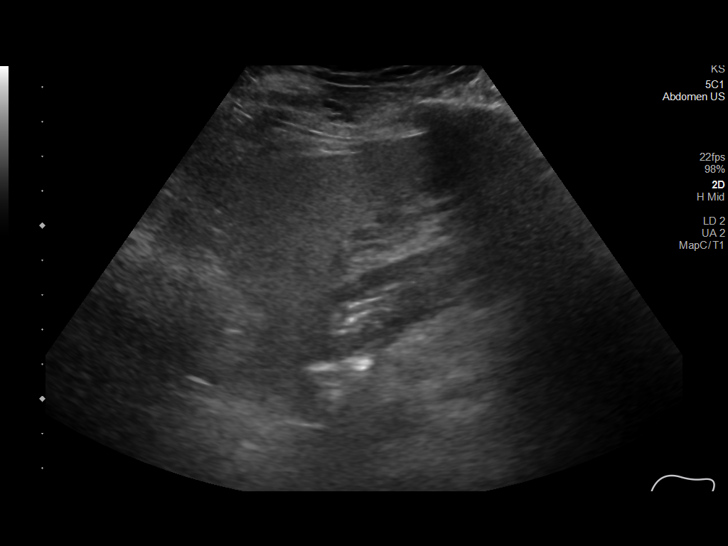
[im 40/53]
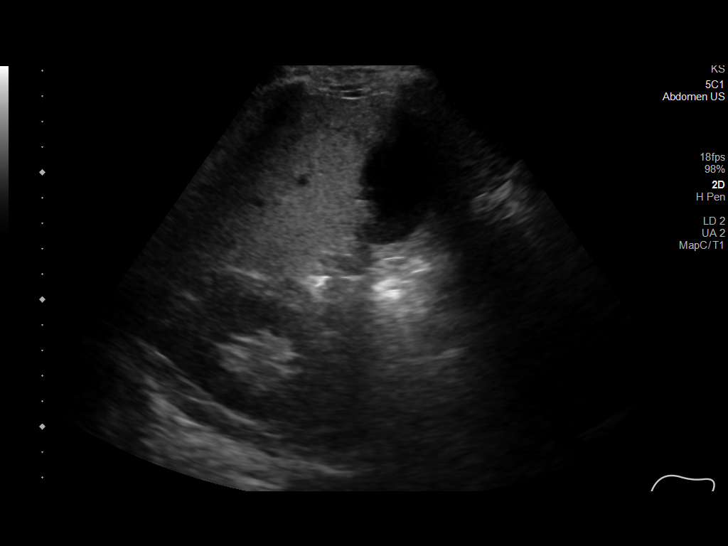
[im 44/53]
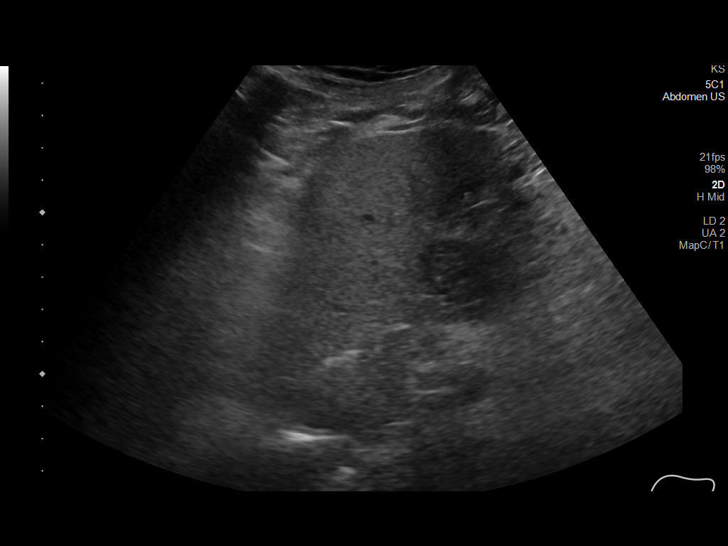
[im 48/53]
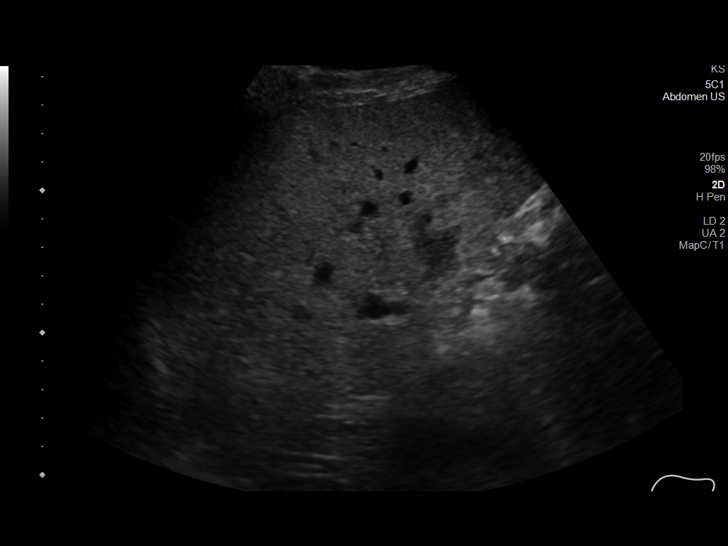
[im 53/53]
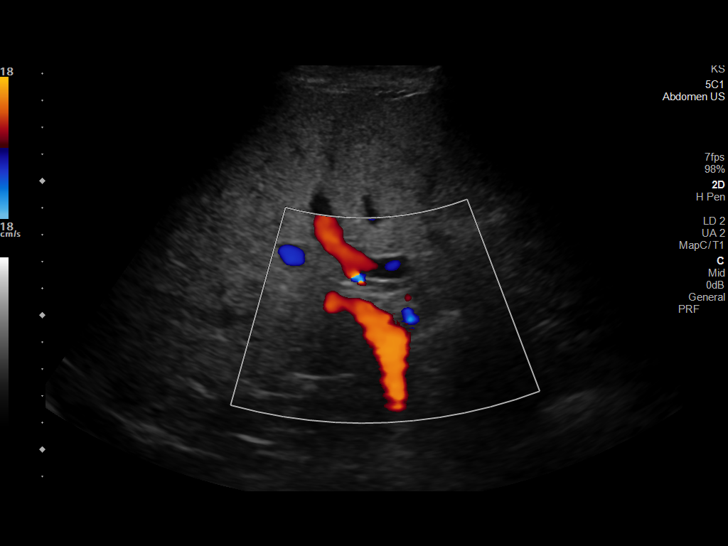

[14 of 25 positions shown; findings below may reference images not displayed]

FINDINGS: Gallbladder:

Multiple gallstones in the gallbladder measuring up to 1.5 cm in
maximum diameter each. No gallbladder wall thickening or
pericholecystic fluid. No sonographic Murphy sign.

Common bile duct:

Diameter: 3.5 mm

Liver:

Diffusely echogenic. Corresponding low density on the recent CT.
Portal vein is patent on color Doppler imaging with normal direction
of blood flow towards the liver.

Other: None.
IMPRESSION: 1. Cholelithiasis without evidence of cholecystitis.
2. Diffuse hepatic steatosis.

## 2022-10-11 ENCOUNTER — Telehealth (HOSPITAL_BASED_OUTPATIENT_CLINIC_OR_DEPARTMENT_OTHER): Payer: Self-pay | Admitting: *Deleted

## 2022-10-11 NOTE — Telephone Encounter (Signed)
Post ED Visit - Positive Culture Follow-up  Culture report reviewed by antimicrobial stewardship pharmacist: [place] Team []  Elenor Quinones, Pharm.D. []  Heide Guile, Pharm.D., BCPS AQ-ID []  Parks Neptune, Pharm.D., BCPS []  Alycia Rossetti, Pharm.D., BCPS []  [place], Pharm.D., BCPS, AAHIVP []  Legrand Como, Pharm.D., BCPS, AAHIVP []  Salome Arnt, PharmD, BCPS []  Johnnette Gourd, PharmD, BCPS []  Hughes Better, PharmD, BCPS []  Leeroy Cha, PharmD []  Laqueta Linden, PharmD, BCPS []  Albertina Parr, PharmD  [place] Team []  Leodis Sias, PharmD []  Lindell Spar, PharmD []  Royetta Asal, PharmD []  Graylin Shiver, Rph []  Rema Fendt) Glennon Mac, PharmD []  Arlyn Dunning, PharmD []  Netta Cedars, PharmD []  Dia Sitter, PharmD []  Leone Haven, PharmD []  Gretta Arab, PharmD []  Theodis Shove, PharmD []  Peggyann Juba, PharmD []  Reuel Boom, PharmD   Positive urine culture Treated with Ciprofloxacin, organism sensitive to the same and no further patient follow-up is required at this time.  Reviewed by Pharmacy  Ardeen Fillers 10/11/2022, 8:49 AM

## 2022-10-13 ENCOUNTER — Encounter: Payer: Self-pay | Admitting: Nurse Practitioner

## 2022-10-13 ENCOUNTER — Ambulatory Visit: Payer: Self-pay | Attending: Nurse Practitioner | Admitting: Nurse Practitioner

## 2022-10-13 VITALS — BP 133/84 | HR 49 | Ht 68.0 in | Wt 144.8 lb

## 2022-10-13 DIAGNOSIS — Z23 Encounter for immunization: Secondary | ICD-10-CM

## 2022-10-13 DIAGNOSIS — Z1211 Encounter for screening for malignant neoplasm of colon: Secondary | ICD-10-CM

## 2022-10-13 DIAGNOSIS — R339 Retention of urine, unspecified: Secondary | ICD-10-CM

## 2022-10-13 DIAGNOSIS — Z7689 Persons encountering health services in other specified circumstances: Secondary | ICD-10-CM

## 2022-10-13 DIAGNOSIS — E039 Hypothyroidism, unspecified: Secondary | ICD-10-CM

## 2022-10-13 DIAGNOSIS — N184 Chronic kidney disease, stage 4 (severe): Secondary | ICD-10-CM

## 2022-10-13 DIAGNOSIS — I10 Essential (primary) hypertension: Secondary | ICD-10-CM

## 2022-10-13 NOTE — Progress Notes (Signed)
Assessment & Plan:  Kohle was seen today for establish care.  Diagnoses and all orders for this visit:  Encounter to establish care  Hypothyroidism, unspecified type -     Thyroid Panel With TSH  Urinary retention -     PSA  CKD (chronic kidney disease) stage 4, GFR 15-29 ml/min (HCC) -     Ambulatory referral to Nephrology  Influenza vaccine needed -     Flu Vaccine QUAD High Dose(Fluad)  Encounter for administration of vaccine -     Pneumococcal conjugate vaccine 20-valent  Colon cancer screening -     Fecal occult blood, imunochemical  Primary hypertension DOSE CHANGE-     atenolol (TENORMIN) 50 MG tablet; Take 1 tablet (50 mg total) by mouth every evening.    Patient has been counseled on age-appropriate routine health concerns for screening and prevention. These are reviewed and up-to-date. Referrals have been placed accordingly. Immunizations are up-to-date or declined.    Subjective:   Chief Complaint  Patient presents with   Establish Care   HPI Thomas Beard 72 y.o. male presents to office today to establish care.   VRI was used to communicate directly with patient for the entire encounter including providing detailed patient instructions.     He has a PMH: HTN, BPH with chronic LUTS, hypothyroidism,   HFU Thomas Beard Was admitted to the hospital 09-22-2022 for evaluation of urinary retention with abnormal OP labs. Apparently he had stopped taking proscar and flomax on his own. His PCP noted renal function decline on labwork and instructed patient to be seen at the hospital. On arrival BP 193/110 Potassium 6.3 BUN 99 Cr+ 9.92 CT renal stone study showed marked bilateral hydronephrosis and bilateral hydroureter's. 8 mm nodular density in the right middle lobe noted.  He was treated for ARF 2/2 urinary obstruction with foley care, proscar and flomax re initiation and NAHCO3 gtt.   IV hydralazine was given for BP and atenolol was continued.  He was  discharged 09-30-2022 to home and instructed to follow up with Urology.  He will need repeat CT of RML in 6 months.   Today he states he has an upcoming appt with Urology   HTN Blood pressure well controlled with atenolol 100 mg daily. However heart rate is low. Will need to decrease atenolol at this time and have him return for BP check.  BP Readings from Last 3 Encounters:  10/13/22 133/84  10/08/22 116/79  09/30/22 (!) 119/90      Hypothyroidism Thyroid level is elevated. He is currently taking levothyroxine 100 mg daily. Will repeat in 4 weeks as I am unsure about his previous adherence taking this.  Lab Results  Component Value Date   TSH 6.920 (H) 10/13/2022    ROS  Past Medical History:  Diagnosis Date   Anemia    Enlarged prostate    Hypertension     Past Surgical History:  Procedure Laterality Date   enlarged prostate      History reviewed. No pertinent family history.  Social History Reviewed with no changes to be made today.   Outpatient Medications Prior to Visit  Medication Sig Dispense Refill   ciprofloxacin (CIPRO) 500 MG tablet Take 1 tablet (500 mg total) by mouth 2 (two) times daily. 14 tablet 0   finasteride (PROSCAR) 5 MG tablet Take 1 tablet (5 mg total) by mouth daily. 30 tablet 1   tamsulosin (FLOMAX) 0.4 MG CAPS capsule Take 1 capsule (0.4 mg total) by  mouth daily. 30 capsule 0   atenolol (TENORMIN) 100 MG tablet Take 100 mg by mouth every evening.     levothyroxine (SYNTHROID) 100 MCG tablet Take 100 mcg by mouth daily before breakfast.     No facility-administered medications prior to visit.    No Known Allergies     Objective:    BP 133/84   Pulse (!) 49 Comment: 111  Ht 5\' 8"  (1.727 m)   Wt 144 lb 12.8 oz (65.7 kg)   SpO2 100%   BMI 22.02 kg/m  Wt Readings from Last 3 Encounters:  10/13/22 144 lb 12.8 oz (65.7 kg)  09/25/22 165 lb (74.8 kg)  02/07/21 156 lb 8.4 oz (71 kg)    Physical Exam Vitals and nursing note reviewed.   Constitutional:      Appearance: He is well-developed.  HENT:     Head: Normocephalic and atraumatic.  Cardiovascular:     Rate and Rhythm: Normal rate and regular rhythm.     Heart sounds: Normal heart sounds. No murmur heard.    No friction rub. No gallop.  Pulmonary:     Effort: Pulmonary effort is normal. No tachypnea or respiratory distress.     Breath sounds: Normal breath sounds. No decreased breath sounds, wheezing, rhonchi or rales.  Chest:     Chest wall: No tenderness.  Abdominal:     General: Bowel sounds are normal.     Palpations: Abdomen is soft.  Musculoskeletal:        General: Normal range of motion.     Cervical back: Normal range of motion.  Skin:    General: Skin is warm and dry.  Neurological:     Mental Status: He is alert and oriented to person, place, and time.     Coordination: Coordination normal.  Psychiatric:        Behavior: Behavior normal. Behavior is cooperative.        Thought Content: Thought content normal.        Judgment: Judgment normal.          Patient has been counseled extensively about nutrition and exercise as well as the importance of adherence with medications and regular follow-up. The patient was given clear instructions to go to ER or return to medical center if symptoms don't improve, worsen or new problems develop. The patient verbalized understanding.   Follow-up: Return in about 4 weeks (around 11/10/2022) for PHysical.   [name], FNP-BC [facility] [place], [place]   10/21/2022, 9:58 PM

## 2022-10-13 NOTE — Progress Notes (Signed)
Jennifer-cone No concerns  Urinal obstruction   Heart rate between 49 and 111  Paulina- daughter

## 2022-10-14 ENCOUNTER — Other Ambulatory Visit: Payer: Self-pay | Admitting: Nurse Practitioner

## 2022-10-14 DIAGNOSIS — N400 Enlarged prostate without lower urinary tract symptoms: Secondary | ICD-10-CM

## 2022-10-14 DIAGNOSIS — E039 Hypothyroidism, unspecified: Secondary | ICD-10-CM

## 2022-10-14 LAB — THYROID PANEL WITH TSH
Free Thyroxine Index: 2.6 (ref 1.2–4.9)
T3 Uptake Ratio: 28 % (ref 24–39)
T4, Total: 9.2 ug/dL (ref 4.5–12.0)
TSH: 6.92 u[IU]/mL — ABNORMAL HIGH (ref 0.450–4.500)

## 2022-10-14 LAB — PSA: Prostate Specific Ag, Serum: 20.6 ng/mL — ABNORMAL HIGH (ref 0.0–4.0)

## 2022-10-14 MED ORDER — LEVOTHYROXINE SODIUM 100 MCG PO TABS: 100.0000 ug | ORAL_TABLET | Freq: Every day | ORAL | 1 refills | Status: DC

## 2022-10-17 LAB — FECAL OCCULT BLOOD, IMMUNOCHEMICAL: Fecal Occult Bld: NEGATIVE

## 2022-10-21 ENCOUNTER — Encounter: Payer: Self-pay | Admitting: Nurse Practitioner

## 2022-10-21 MED ORDER — ATENOLOL 100 MG PO TABS: 100.0000 mg | ORAL_TABLET | Freq: Every evening | ORAL | 1 refills | Status: DC

## 2022-10-21 MED ORDER — ATENOLOL 50 MG PO TABS: 50.0000 mg | ORAL_TABLET | Freq: Every evening | ORAL | 0 refills | Status: DC

## 2022-10-22 ENCOUNTER — Other Ambulatory Visit: Payer: Self-pay

## 2022-10-22 DIAGNOSIS — E039 Hypothyroidism, unspecified: Secondary | ICD-10-CM

## 2022-10-22 MED ORDER — LEVOTHYROXINE SODIUM 100 MCG PO TABS: 100.0000 ug | ORAL_TABLET | Freq: Every day | ORAL | 1 refills | Status: DC

## 2022-10-22 NOTE — Progress Notes (Signed)
Thomas Beard , patients daughter was giver verbal permission by patient near by to receive information. Patient identified by name and date of birth and voiced understanding.

## 2022-11-22 ENCOUNTER — Ambulatory Visit: Payer: Self-pay | Attending: Nurse Practitioner | Admitting: Nurse Practitioner

## 2023-01-12 ENCOUNTER — Ambulatory Visit: Payer: Self-pay | Attending: Nurse Practitioner | Admitting: Nurse Practitioner

## 2023-01-12 ENCOUNTER — Other Ambulatory Visit: Payer: Self-pay

## 2023-01-12 VITALS — BP 132/87 | HR 61 | Ht 68.0 in | Wt 154.0 lb

## 2023-01-12 DIAGNOSIS — E039 Hypothyroidism, unspecified: Secondary | ICD-10-CM

## 2023-01-12 DIAGNOSIS — Z1159 Encounter for screening for other viral diseases: Secondary | ICD-10-CM

## 2023-01-12 DIAGNOSIS — Z23 Encounter for immunization: Secondary | ICD-10-CM

## 2023-01-12 DIAGNOSIS — E7800 Pure hypercholesterolemia, unspecified: Secondary | ICD-10-CM

## 2023-01-12 DIAGNOSIS — N138 Other obstructive and reflux uropathy: Secondary | ICD-10-CM

## 2023-01-12 DIAGNOSIS — I10 Essential (primary) hypertension: Secondary | ICD-10-CM

## 2023-01-12 DIAGNOSIS — N179 Acute kidney failure, unspecified: Secondary | ICD-10-CM

## 2023-01-12 DIAGNOSIS — N401 Enlarged prostate with lower urinary tract symptoms: Secondary | ICD-10-CM

## 2023-01-12 MED ORDER — TAMSULOSIN HCL 0.4 MG PO CAPS: 0.4000 mg | ORAL_CAPSULE | Freq: Every day | ORAL | 1 refills | Status: DC

## 2023-01-12 MED ORDER — LEVOTHYROXINE SODIUM 100 MCG PO TABS
100.0000 ug | ORAL_TABLET | Freq: Every day | ORAL | 1 refills | Status: DC
Start: 2023-01-12 — End: 2023-01-19

## 2023-01-12 MED ORDER — ZOSTER VAC RECOMB ADJUVANTED 50 MCG/0.5ML IM SUSR: 0.5000 mL | Freq: Once | INTRAMUSCULAR | 0 refills | Status: AC

## 2023-01-12 MED ORDER — FINASTERIDE 5 MG PO TABS: 5.0000 mg | ORAL_TABLET | Freq: Every day | ORAL | 1 refills | Status: DC

## 2023-01-12 NOTE — Progress Notes (Signed)
Assessment & Plan:  Thomas Beard was seen today for hypertension.  Diagnoses and all orders for this visit:  Primary hypertension Continue all antihypertensives as prescribed.  Reminded to bring in blood pressure log for follow  up appointment.  RECOMMENDATIONS: DASH/Mediterranean Diets are healthier choices for HTN.    Hypothyroidism, unspecified type -     Thyroid Panel With TSH -     levothyroxine (SYNTHROID) 100 MCG tablet; Take 1 tablet (100 mcg total) by mouth daily before breakfast.  Hypercholesterolemia -     Lipid panel  AKI (acute kidney injury) -     CMP14+EGFR -     CBC with Differential  Need for hepatitis C screening test -     HCV Ab w Reflex to Quant PCR  BPH with urinary obstruction -     tamsulosin (FLOMAX) 0.4 MG CAPS capsule; Take 1 capsule (0.4 mg total) by mouth daily. -     finasteride (PROSCAR) 5 MG tablet; Take 1 tablet (5 mg total) by mouth daily.  Need for shingles vaccine -     Zoster Vaccine Adjuvanted [facility]) injection; Inject 0.5 mLs into the muscle once for 1 dose.    Patient has been counseled on age-appropriate routine health concerns for screening and prevention. These are reviewed and up-to-date. Referrals have been placed accordingly. Immunizations are up-to-date or declined.    Subjective:   Chief Complaint  Patient presents with   Hypertension   Hypertension Pertinent negatives include no blurred vision, chest pain, headaches, malaise/fatigue, palpitations or shortness of breath.   Thomas Beard 72 y.o. male presents to office today for follow up to HTN. He is accompanied by his daughter who is translating for him today.  He has a PMH: HTN, BPH with chronic LUTS, hypothyroidism  She is in the process of trying to get him approved for medicaid.   Incidental finding on CT renal stone study revealed RML 27mm lung nodule. He will need lung CT scheduled in June. She would like to push this out until August as she will be  traveling out of the country    HTN Blood pressure is well controlled. He is no longer taking atenolol and reports normal readings at home as well.  BP Readings from Last 3 Encounters:  01/12/23 132/87  10/13/22 133/84  10/08/22 116/79    Hypothyroidism Thyroid level is elevated. He is currently taking levothyroxine 100 mg daily. Denies any symptoms of hypo or hyperthyoidism.  Lab Results  Component Value Date   TSH 6.920 (H) 10/13/2022   T4TOTAL 9.2 10/13/2022    Review of Systems  Constitutional:  Negative for fever, malaise/fatigue and weight loss.  HENT: Negative.  Negative for nosebleeds.   Eyes: Negative.  Negative for blurred vision, double vision and photophobia.  Respiratory: Negative.  Negative for cough and shortness of breath.   Cardiovascular: Negative.  Negative for chest pain, palpitations and leg swelling.  Gastrointestinal: Negative.  Negative for heartburn, nausea and vomiting.  Genitourinary:        BPH  Musculoskeletal: Negative.  Negative for myalgias.  Neurological: Negative.  Negative for dizziness, focal weakness, seizures and headaches.  Psychiatric/Behavioral: Negative.  Negative for suicidal ideas.     Past Medical History:  Diagnosis Date   Anemia    Enlarged prostate    Hypertension     Past Surgical History:  Procedure Laterality Date   enlarged prostate      History reviewed. No pertinent family history.  Social History Reviewed with no  changes to be made today.   Outpatient Medications Prior to Visit  Medication Sig Dispense Refill   atenolol (TENORMIN) 50 MG tablet Take 1 tablet (50 mg total) by mouth every evening. 90 tablet 0   finasteride (PROSCAR) 5 MG tablet Take 1 tablet (5 mg total) by mouth daily. 30 tablet 1   levothyroxine (SYNTHROID) 100 MCG tablet Take 1 tablet (100 mcg total) by mouth daily before breakfast. 90 tablet 1   ciprofloxacin (CIPRO) 500 MG tablet Take 1 tablet (500 mg total) by mouth 2 (two) times daily.  (Patient not taking: Reported on 01/12/2023) 14 tablet 0   tamsulosin (FLOMAX) 0.4 MG CAPS capsule Take 1 capsule (0.4 mg total) by mouth daily. (Patient not taking: Reported on 01/12/2023) 30 capsule 0   No facility-administered medications prior to visit.    No Known Allergies     Objective:    BP 132/87   Pulse 61   Ht 5\' 8"  (1.727 m)   Wt 154 lb (69.9 kg)   SpO2 97%   BMI 23.42 kg/m  Wt Readings from Last 3 Encounters:  01/12/23 154 lb (69.9 kg)  10/13/22 144 lb 12.8 oz (65.7 kg)  09/25/22 165 lb (74.8 kg)    Physical Exam Vitals and nursing note reviewed.  Constitutional:      Appearance: He is well-developed.  HENT:     Head: Normocephalic and atraumatic.  Cardiovascular:     Rate and Rhythm: Normal rate and regular rhythm.     Heart sounds: Normal heart sounds. No murmur heard.    No friction rub. No gallop.  Pulmonary:     Effort: Pulmonary effort is normal. No tachypnea or respiratory distress.     Breath sounds: Normal breath sounds. No decreased breath sounds, wheezing, rhonchi or rales.  Chest:     Chest wall: No tenderness.  Abdominal:     General: Bowel sounds are normal.     Palpations: Abdomen is soft.  Musculoskeletal:        General: Normal range of motion.     Cervical back: Normal range of motion.  Skin:    General: Skin is warm and dry.  Neurological:     Mental Status: He is alert and oriented to person, place, and time.     Coordination: Coordination normal.  Psychiatric:        Behavior: Behavior normal. Behavior is cooperative.        Thought Content: Thought content normal.        Judgment: Judgment normal.          Patient has been counseled extensively about nutrition and exercise as well as the importance of adherence with medications and regular follow-up. The patient was given clear instructions to go to ER or return to medical center if symptoms don't improve, worsen or new problems develop. The patient verbalized  understanding.   Follow-up: Return in about 4 months (around 05/14/2023) for F/U AUGUST.   Claiborne Rigg, FNP-BC [facility] [place], [place] [phone]   01/12/2023, 3:02 PM

## 2023-01-13 LAB — CBC WITH DIFFERENTIAL/PLATELET
Basophils Absolute: 0.1 10*3/uL (ref 0.0–0.2)
Basos: 1 %
EOS (ABSOLUTE): 0.5 10*3/uL — ABNORMAL HIGH (ref 0.0–0.4)
Eos: 8 %
Hematocrit: 40.3 % (ref 37.5–51.0)
Hemoglobin: 13.4 g/dL (ref 13.0–17.7)
Immature Grans (Abs): 0 10*3/uL (ref 0.0–0.1)
Immature Granulocytes: 1 %
Lymphocytes Absolute: 0.9 10*3/uL (ref 0.7–3.1)
Lymphs: 16 %
MCH: 31.2 pg (ref 26.6–33.0)
MCHC: 33.3 g/dL (ref 31.5–35.7)
MCV: 94 fL (ref 79–97)
Monocytes Absolute: 0.4 10*3/uL (ref 0.1–0.9)
Monocytes: 8 %
Neutrophils Absolute: 3.9 10*3/uL (ref 1.4–7.0)
Neutrophils: 66 %
Platelets: 314 10*3/uL (ref 150–450)
RBC: 4.3 x10E6/uL (ref 4.14–5.80)
RDW: 13.4 % (ref 11.6–15.4)
WBC: 5.8 10*3/uL (ref 3.4–10.8)

## 2023-01-13 LAB — CMP14+EGFR
ALT: 12 IU/L (ref 0–44)
AST: 16 IU/L (ref 0–40)
Albumin/Globulin Ratio: 1.7 (ref 1.2–2.2)
Albumin: 4.4 g/dL (ref 3.8–4.8)
Alkaline Phosphatase: 106 IU/L (ref 44–121)
BUN/Creatinine Ratio: 18 (ref 10–24)
BUN: 30 mg/dL — ABNORMAL HIGH (ref 8–27)
Bilirubin Total: 0.2 mg/dL (ref 0.0–1.2)
CO2: 20 mmol/L (ref 20–29)
Calcium: 9.3 mg/dL (ref 8.6–10.2)
Chloride: 105 mmol/L (ref 96–106)
Creatinine, Ser: 1.71 mg/dL — ABNORMAL HIGH (ref 0.76–1.27)
Globulin, Total: 2.6 g/dL (ref 1.5–4.5)
Glucose: 84 mg/dL (ref 70–99)
Potassium: 4.8 mmol/L (ref 3.5–5.2)
Sodium: 138 mmol/L (ref 134–144)
Total Protein: 7 g/dL (ref 6.0–8.5)
eGFR: 42 mL/min/{1.73_m2} — ABNORMAL LOW (ref >59)

## 2023-01-13 LAB — LIPID PANEL
Chol/HDL Ratio: 4.7 ratio (ref 0.0–5.0)
Cholesterol, Total: 242 mg/dL — ABNORMAL HIGH (ref 100–199)
HDL: 51 mg/dL (ref >39)
LDL Chol Calc (NIH): 171 mg/dL — ABNORMAL HIGH (ref 0–99)
Triglycerides: 114 mg/dL (ref 0–149)
VLDL Cholesterol Cal: 20 mg/dL (ref 5–40)

## 2023-01-13 LAB — HCV AB W REFLEX TO QUANT PCR: HCV Ab: NONREACTIVE

## 2023-01-13 LAB — THYROID PANEL WITH TSH
Free Thyroxine Index: 1.4 (ref 1.2–4.9)
T3 Uptake Ratio: 26 % (ref 24–39)
T4, Total: 5.3 ug/dL (ref 4.5–12.0)
TSH: 10.4 u[IU]/mL — ABNORMAL HIGH (ref 0.450–4.500)

## 2023-01-13 LAB — HCV INTERPRETATION

## 2023-01-19 ENCOUNTER — Other Ambulatory Visit: Payer: Self-pay | Admitting: Nurse Practitioner

## 2023-01-19 DIAGNOSIS — E039 Hypothyroidism, unspecified: Secondary | ICD-10-CM

## 2023-01-19 DIAGNOSIS — N1832 Chronic kidney disease, stage 3b: Secondary | ICD-10-CM

## 2023-01-19 MED ORDER — LEVOTHYROXINE SODIUM 112 MCG PO TABS
112.0000 ug | ORAL_TABLET | Freq: Every day | ORAL | 1 refills | Status: DC
Start: 2023-01-19 — End: 2023-06-28

## 2023-01-19 MED ORDER — ATORVASTATIN CALCIUM 20 MG PO TABS: 20.0000 mg | ORAL_TABLET | Freq: Every day | ORAL | 3 refills | Status: DC

## 2023-04-13 ENCOUNTER — Ambulatory Visit: Payer: Self-pay | Admitting: Nurse Practitioner

## 2023-05-17 ENCOUNTER — Ambulatory Visit: Payer: Self-pay | Admitting: Nurse Practitioner

## 2023-06-28 ENCOUNTER — Encounter: Payer: Self-pay | Admitting: Nurse Practitioner

## 2023-06-28 ENCOUNTER — Ambulatory Visit: Payer: Self-pay | Attending: Nurse Practitioner | Admitting: Nurse Practitioner

## 2023-06-28 VITALS — BP 150/91 | HR 65 | Ht 68.0 in | Wt 161.4 lb

## 2023-06-28 DIAGNOSIS — N138 Other obstructive and reflux uropathy: Secondary | ICD-10-CM

## 2023-06-28 DIAGNOSIS — E039 Hypothyroidism, unspecified: Secondary | ICD-10-CM

## 2023-06-28 DIAGNOSIS — D649 Anemia, unspecified: Secondary | ICD-10-CM

## 2023-06-28 DIAGNOSIS — R911 Solitary pulmonary nodule: Secondary | ICD-10-CM

## 2023-06-28 DIAGNOSIS — I10 Essential (primary) hypertension: Secondary | ICD-10-CM

## 2023-06-28 DIAGNOSIS — N401 Enlarged prostate with lower urinary tract symptoms: Secondary | ICD-10-CM

## 2023-06-28 DIAGNOSIS — E7800 Pure hypercholesterolemia, unspecified: Secondary | ICD-10-CM

## 2023-06-28 MED ORDER — ATORVASTATIN CALCIUM 20 MG PO TABS
20.0000 mg | ORAL_TABLET | Freq: Every day | ORAL | 3 refills | Status: DC
Start: 2023-06-28 — End: 2024-04-11

## 2023-06-28 MED ORDER — FINASTERIDE 5 MG PO TABS
5.0000 mg | ORAL_TABLET | Freq: Every day | ORAL | 1 refills | Status: AC
Start: 2023-06-28 — End: ?

## 2023-06-28 MED ORDER — TAMSULOSIN HCL 0.4 MG PO CAPS
0.4000 mg | ORAL_CAPSULE | Freq: Every day | ORAL | 1 refills | Status: AC
Start: 2023-06-28 — End: ?

## 2023-06-28 MED ORDER — LEVOTHYROXINE SODIUM 112 MCG PO TABS
112.0000 ug | ORAL_TABLET | Freq: Every day | ORAL | 1 refills | Status: DC
Start: 2023-06-28 — End: 2023-07-09

## 2023-06-28 MED ORDER — ATENOLOL 50 MG PO TABS
50.0000 mg | ORAL_TABLET | Freq: Every evening | ORAL | 1 refills | Status: DC
Start: 2023-06-28 — End: 2024-04-11

## 2023-06-28 NOTE — Progress Notes (Signed)
Assessment & Plan:  Dugan was seen today for medical management of chronic issues.  Diagnoses and all orders for this visit:  Primary hypertension Blood pressure is elevated.  -     atenolol (TENORMIN) 50 MG tablet; Take 1 tablet (50 mg total) by mouth every evening. FOR Hypertension -     CMP14+EGFR Continue all antihypertensives as prescribed.  Reminded to bring in blood pressure log for follow  up appointment.  RECOMMENDATIONS: DASH/Mediterranean Diets are healthier choices for HTN.    BPH with urinary obstruction -     finasteride (PROSCAR) 5 MG tablet; Take 1 tablet (5 mg total) by mouth daily. For prostate -     tamsulosin (FLOMAX) 0.4 MG CAPS capsule; Take 1 capsule (0.4 mg total) by mouth daily. For prostate  Hypothyroidism, unspecified type -     levothyroxine (SYNTHROID) 112 MCG tablet; Take 1 tablet (112 mcg total) by mouth daily before breakfast. For thyroid -     Thyroid Panel With TSH  Anemia, unspecified type -     CBC with Differential  Hypercholesterolemia -     atorvastatin (LIPITOR) 20 MG tablet; Take 1 tablet (20 mg total) by mouth daily. For cholesterol INSTRUCTIONS: Work on a low fat, heart healthy diet and participate in regular aerobic exercise program by working out at least 150 minutes per week; 5 days a week-30 minutes per day. Avoid red meat/beef/steak,  fried foods. junk foods, sodas, sugary drinks, unhealthy snacking, alcohol and smoking.  Drink at least 80 oz of water per day and monitor your carbohydrate intake daily.    Lung nodule -     CT CHEST WO CONTRAST; Future    Patient has been counseled on age-appropriate routine health concerns for screening and prevention. These are reviewed and up-to-date. Referrals have been placed accordingly. Immunizations are up-to-date or declined.    Subjective:   Chief Complaint  Patient presents with   Medical Management of Chronic Issues   HPI Thomas Beard 72 y.o. male presents to office  today for follow up to HTN  He is accompanied by his daughter today who is translating for him.   Incidental finding on CT renal stone study laste December revealed RML 8mm lung nodule. He will need lung CT. This was scheduled today.   HTN Blood pressure is elevated. He is not taking atenolol 50 mg every day as prescribed and instead takes it every other day and sometimes not even then.  BP Readings from Last 3 Encounters:  06/28/23 (!) 150/91  01/12/23 132/87  10/13/22 133/84     GU He has not been able to follow up with Urology to have his prostate surgery due to lack of insurance. I have instructed his daughter to pick up an application from the community clinic.  His BPH symptoms include urinary hesitancy and decreased stream.    Thyroid levels not at goal. He did not return for follow up TSH several months ago. Currently taking levothyroxine 112 mg daily as prescribed.  Lab Results  Component Value Date   TSH 10.400 (H) 01/12/2023     Review of Systems  Constitutional:  Negative for fever, malaise/fatigue and weight loss.  HENT: Negative.  Negative for nosebleeds.   Eyes: Negative.  Negative for blurred vision, double vision and photophobia.  Respiratory: Negative.  Negative for cough and shortness of breath.   Cardiovascular: Negative.  Negative for chest pain, palpitations and leg swelling.  Gastrointestinal: Negative.  Negative for heartburn, nausea and vomiting.  Genitourinary:  Negative for dysuria and frequency.       SEE HPI  Musculoskeletal: Negative.  Negative for myalgias.  Neurological: Negative.  Negative for dizziness, focal weakness, seizures and headaches.  Psychiatric/Behavioral: Negative.  Negative for suicidal ideas.     Past Medical History:  Diagnosis Date   Anemia    Enlarged prostate    Hypertension     Past Surgical History:  Procedure Laterality Date   enlarged prostate      History reviewed. No pertinent family history.  Social  History Reviewed with no changes to be made today.   Outpatient Medications Prior to Visit  Medication Sig Dispense Refill   atenolol (TENORMIN) 50 MG tablet Take 1 tablet (50 mg total) by mouth every evening. 90 tablet 0   atorvastatin (LIPITOR) 20 MG tablet Take 1 tablet (20 mg total) by mouth daily. 90 tablet 3   finasteride (PROSCAR) 5 MG tablet Take 1 tablet (5 mg total) by mouth daily. 90 tablet 1   levothyroxine (SYNTHROID) 112 MCG tablet Take 1 tablet (112 mcg total) by mouth daily before breakfast. 30 tablet 1   tamsulosin (FLOMAX) 0.4 MG CAPS capsule Take 1 capsule (0.4 mg total) by mouth daily. 90 capsule 1   No facility-administered medications prior to visit.    No Known Allergies     Objective:    BP (!) 150/91 (BP Location: Left Arm, Patient Position: Sitting, Cuff Size: Normal)   Pulse 65   Ht 5\' 8"  (1.727 m)   Wt 161 lb 6.4 oz (73.2 kg)   SpO2 95%   BMI 24.54 kg/m  Wt Readings from Last 3 Encounters:  06/28/23 161 lb 6.4 oz (73.2 kg)  01/12/23 154 lb (69.9 kg)  10/13/22 144 lb 12.8 oz (65.7 kg)    Physical Exam Vitals and nursing note reviewed.  Constitutional:      Appearance: He is well-developed.  HENT:     Head: Normocephalic and atraumatic.  Cardiovascular:     Rate and Rhythm: Normal rate and regular rhythm.     Heart sounds: Normal heart sounds. No murmur heard.    No friction rub. No gallop.  Pulmonary:     Effort: Pulmonary effort is normal. No tachypnea or respiratory distress.     Breath sounds: Normal breath sounds. No decreased breath sounds, wheezing, rhonchi or rales.  Chest:     Chest wall: No tenderness.  Abdominal:     General: Bowel sounds are normal.     Palpations: Abdomen is soft.  Musculoskeletal:        General: Normal range of motion.     Cervical back: Normal range of motion.  Skin:    General: Skin is warm and dry.  Neurological:     Mental Status: He is alert and oriented to person, place, and time.     Coordination:  Coordination normal.  Psychiatric:        Behavior: Behavior normal. Behavior is cooperative.        Thought Content: Thought content normal.        Judgment: Judgment normal.          Patient has been counseled extensively about nutrition and exercise as well as the importance of adherence with medications and regular follow-up. The patient was given clear instructions to go to ER or return to medical center if symptoms don't improve, worsen or new problems develop. The patient verbalized understanding.   Follow-up: Return in about 3 months (around 09/27/2023).  Claiborne Rigg, FNP-BC [facility] [place], [place] [phone]   06/28/2023, 4:14 PM

## 2023-06-29 LAB — CBC WITH DIFFERENTIAL/PLATELET
Basophils Absolute: 0.1 10*3/uL (ref 0.0–0.2)
Basos: 1 %
EOS (ABSOLUTE): 0.3 10*3/uL (ref 0.0–0.4)
Eos: 5 %
Hematocrit: 42.8 % (ref 37.5–51.0)
Hemoglobin: 14.7 g/dL (ref 13.0–17.7)
Immature Grans (Abs): 0.1 10*3/uL (ref 0.0–0.1)
Immature Granulocytes: 1 %
Lymphocytes Absolute: 1.2 10*3/uL (ref 0.7–3.1)
Lymphs: 20 %
MCH: 32.2 pg (ref 26.6–33.0)
MCHC: 34.3 g/dL (ref 31.5–35.7)
MCV: 94 fL (ref 79–97)
Monocytes Absolute: 0.6 10*3/uL (ref 0.1–0.9)
Monocytes: 9 %
Neutrophils Absolute: 3.9 10*3/uL (ref 1.4–7.0)
Neutrophils: 64 %
Platelets: 260 10*3/uL (ref 150–450)
RBC: 4.56 x10E6/uL (ref 4.14–5.80)
RDW: 14.3 % (ref 11.6–15.4)
WBC: 6.1 10*3/uL (ref 3.4–10.8)

## 2023-06-29 LAB — CMP14+EGFR
ALT: 14 IU/L (ref 0–44)
AST: 15 IU/L (ref 0–40)
Albumin: 4.7 g/dL (ref 3.8–4.8)
Alkaline Phosphatase: 94 IU/L (ref 44–121)
BUN/Creatinine Ratio: 18 (ref 10–24)
BUN: 29 mg/dL — ABNORMAL HIGH (ref 8–27)
Bilirubin Total: 0.4 mg/dL (ref 0.0–1.2)
CO2: 19 mmol/L — ABNORMAL LOW (ref 20–29)
Calcium: 9.6 mg/dL (ref 8.6–10.2)
Chloride: 107 mmol/L — ABNORMAL HIGH (ref 96–106)
Creatinine, Ser: 1.63 mg/dL — ABNORMAL HIGH (ref 0.76–1.27)
Globulin, Total: 2.3 g/dL (ref 1.5–4.5)
Glucose: 89 mg/dL (ref 70–99)
Potassium: 4.7 mmol/L (ref 3.5–5.2)
Sodium: 142 mmol/L (ref 134–144)
Total Protein: 7 g/dL (ref 6.0–8.5)
eGFR: 44 mL/min/{1.73_m2} — ABNORMAL LOW (ref >59)

## 2023-06-29 LAB — THYROID PANEL WITH TSH
Free Thyroxine Index: 1.1 — ABNORMAL LOW (ref 1.2–4.9)
T3 Uptake Ratio: 23 % — ABNORMAL LOW (ref 24–39)
T4, Total: 4.9 ug/dL (ref 4.5–12.0)
TSH: 45.1 u[IU]/mL — ABNORMAL HIGH (ref 0.450–4.500)

## 2023-07-06 ENCOUNTER — Ambulatory Visit (HOSPITAL_COMMUNITY): Payer: Self-pay

## 2023-07-09 ENCOUNTER — Other Ambulatory Visit: Payer: Self-pay | Admitting: Nurse Practitioner

## 2023-07-09 DIAGNOSIS — E039 Hypothyroidism, unspecified: Secondary | ICD-10-CM

## 2023-07-09 MED ORDER — LEVOTHYROXINE SODIUM 125 MCG PO TABS
125.0000 ug | ORAL_TABLET | Freq: Every day | ORAL | 1 refills | Status: DC
Start: 2023-07-09 — End: 2024-04-11

## 2023-09-30 ENCOUNTER — Ambulatory Visit (HOSPITAL_COMMUNITY): Payer: Self-pay | Attending: Nurse Practitioner

## 2023-10-04 ENCOUNTER — Ambulatory Visit: Payer: Self-pay | Admitting: Nurse Practitioner

## 2023-10-31 ENCOUNTER — Ambulatory Visit: Payer: Self-pay | Admitting: Nurse Practitioner

## 2024-04-10 ENCOUNTER — Telehealth: Payer: Self-pay | Admitting: Nurse Practitioner

## 2024-04-10 NOTE — Telephone Encounter (Signed)
 Called pt to confirm appt. Pt will be present.

## 2024-04-11 ENCOUNTER — Encounter: Payer: Self-pay | Admitting: Nurse Practitioner

## 2024-04-11 ENCOUNTER — Ambulatory Visit: Payer: Self-pay | Attending: Nurse Practitioner | Admitting: Nurse Practitioner

## 2024-04-11 VITALS — BP 163/93 | HR 58 | Resp 19 | Ht 68.0 in | Wt 167.8 lb

## 2024-04-11 DIAGNOSIS — Z1211 Encounter for screening for malignant neoplasm of colon: Secondary | ICD-10-CM

## 2024-04-11 DIAGNOSIS — E039 Hypothyroidism, unspecified: Secondary | ICD-10-CM

## 2024-04-11 DIAGNOSIS — I10 Essential (primary) hypertension: Secondary | ICD-10-CM

## 2024-04-11 DIAGNOSIS — E7800 Pure hypercholesterolemia, unspecified: Secondary | ICD-10-CM

## 2024-04-11 MED ORDER — LEVOTHYROXINE SODIUM 125 MCG PO TABS: 125.0000 ug | ORAL_TABLET | Freq: Every day | ORAL | 0 refills | Status: AC

## 2024-04-11 MED ORDER — ATENOLOL 50 MG PO TABS: 50.0000 mg | ORAL_TABLET | Freq: Every evening | ORAL | 1 refills | Status: AC

## 2024-04-11 MED ORDER — ATORVASTATIN CALCIUM 20 MG PO TABS: 20.0000 mg | ORAL_TABLET | Freq: Every day | ORAL | 3 refills | Status: AC

## 2024-04-11 NOTE — Progress Notes (Signed)
 Assessment & Plan:  Essex was seen today for medication refill.  Primary hypertension -     atenolol  (TENORMIN ) 50 MG tablet; Take 1 tablet (50 mg total) by mouth every evening. FOR Hypertension -     CMP14+EGFR Blood pressure elevated due to lapse in medication. - Refill antihypertensive medication. - Schedule follow-up in 4-6 weeks to recheck blood pressure.   Hypothyroidism, unspecified type -     levothyroxine  (SYNTHROID ) 125 MCG tablet; Take 1 tablet (125 mcg total) by mouth daily before breakfast. For thyroid  -     Thyroid  Panel With TSH Thyroid  dysfunction Previous thyroid  level elevated. No symptoms linked to thyroid  issues. - Order thyroid  function tests. - Schedule follow-up in 4-6 weeks to recheck thyroid  levels.  Hypercholesterolemia -     atorvastatin  (LIPITOR) 20 MG tablet; Take 1 tablet (20 mg total) by mouth daily. For cholesterol INSTRUCTIONS: Work on a low fat, heart healthy diet and participate in regular aerobic exercise program by working out at least 150 minutes per week; 5 days a week-30 minutes per day. Avoid red meat/beef/steak,  fried foods. junk foods, sodas, sugary drinks, unhealthy snacking, alcohol and smoking.  Drink at least 80 oz of water per day and monitor your carbohydrate intake daily.    Colon cancer screening -     Fecal occult blood, imunochemical  Patient has been counseled on age-appropriate routine health concerns for screening and prevention. These are reviewed and up-to-date. Referrals have been placed accordingly. Immunizations are up-to-date or declined.    Subjective:   Chief Complaint  Patient presents with   Medication Refill    Thomas Beard 73 y.o. male presents to office today for follow up to elevated thyroid  levels and hypertension management.  VRI was used to communicate directly with patient for the entire encounter including providing detailed patient instructions.    Hypothyroidism - Elevated thyroid  levels  on previous laboratory testing for which he did not return for repeat bloodwork - No symptoms of hypothyroidism or hyperthyroidism, including no constipation, fatigue, excessive drowsiness, or dizziness He has been out of levothyroxine   Hypertension BP not controlled.  - Mild headaches attributed to elevated blood pressure - Has not taken blood pressure medication for approximately two months due to running out of medication BP Readings from Last 3 Encounters:  04/11/24 (!) 163/93  06/28/23 (!) 150/91  01/12/23 132/87     Lower urinary tract symptoms and urologic management - History of elevated prostate-specific antigen (PSA) levels noted last year - Indwelling catheter changed monthly at [facility] - Seeking surgical intervention for urologic condition but unable to proceed due to lack of insurance Patient was urged to apply for the financial assistance program.  They were instructed to inquire at the front desk about the application process for the  discount, orange card or other financial assistance.        Review of Systems  Constitutional:  Negative for fever, malaise/fatigue and weight loss.  HENT: Negative.  Negative for nosebleeds.   Eyes: Negative.  Negative for blurred vision, double vision and photophobia.  Respiratory: Negative.  Negative for cough and shortness of breath.   Cardiovascular: Negative.  Negative for chest pain, palpitations and leg swelling.  Gastrointestinal: Negative.  Negative for heartburn, nausea and vomiting.  Genitourinary:        SEE HPI  Musculoskeletal: Negative.  Negative for myalgias.  Neurological: Negative.  Negative for dizziness, focal weakness, seizures and headaches.  Psychiatric/Behavioral: Negative.  Negative for suicidal ideas.  Past Medical History:  Diagnosis Date   Anemia    Enlarged prostate    Hypertension     Past Surgical History:  Procedure Laterality Date   enlarged prostate      History  reviewed. No pertinent family history.  Social History Reviewed with no changes to be made today.   Outpatient Medications Prior to Visit  Medication Sig Dispense Refill   finasteride  (PROSCAR ) 5 MG tablet Take 1 tablet (5 mg total) by mouth daily. For prostate 90 tablet 1   tamsulosin  (FLOMAX ) 0.4 MG CAPS capsule Take 1 capsule (0.4 mg total) by mouth daily. For prostate 90 capsule 1   atenolol  (TENORMIN ) 50 MG tablet Take 1 tablet (50 mg total) by mouth every evening. FOR Hypertension 90 tablet 1   atorvastatin  (LIPITOR) 20 MG tablet Take 1 tablet (20 mg total) by mouth daily. For cholesterol 90 tablet 3   levothyroxine  (SYNTHROID ) 125 MCG tablet Take 1 tablet (125 mcg total) by mouth daily before breakfast. For thyroid  30 tablet 1   No facility-administered medications prior to visit.    No Known Allergies     Objective:    BP (!) 163/93 (BP Location: Left Arm, Patient Position: Sitting, Cuff Size: Normal)   Pulse (!) 58   Resp 19   Ht 5' 8 (1.727 m)   Wt 167 lb 12.8 oz (76.1 kg)   SpO2 100%   BMI 25.51 kg/m  Wt Readings from Last 3 Encounters:  04/11/24 167 lb 12.8 oz (76.1 kg)  06/28/23 161 lb 6.4 oz (73.2 kg)  01/12/23 154 lb (69.9 kg)    Physical Exam Vitals and nursing note reviewed.  Constitutional:      Appearance: He is well-developed.  HENT:     Head: Normocephalic and atraumatic.  Cardiovascular:     Rate and Rhythm: Normal rate and regular rhythm.     Heart sounds: Normal heart sounds. No murmur heard.    No friction rub. No gallop.  Pulmonary:     Effort: Pulmonary effort is normal. No tachypnea or respiratory distress.     Breath sounds: Normal breath sounds. No decreased breath sounds, wheezing, rhonchi or rales.  Chest:     Chest wall: No tenderness.  Abdominal:     General: Bowel sounds are normal.     Palpations: Abdomen is soft.  Musculoskeletal:        General: Normal range of motion.     Cervical back: Normal range of motion.  Skin:     General: Skin is warm and dry.  Neurological:     Mental Status: He is alert and oriented to person, place, and time.     Coordination: Coordination normal.  Psychiatric:        Behavior: Behavior normal. Behavior is cooperative.        Thought Content: Thought content normal.        Judgment: Judgment normal.          Patient has been counseled extensively about nutrition and exercise as well as the importance of adherence with medications and regular follow-up. The patient was given clear instructions to go to ER or return to medical center if symptoms don't improve, worsen or new problems develop. The patient verbalized understanding.   Follow-up: Return in about 4 weeks (around 05/09/2024) for BP CHECK WITH [facility] and repeat TSH. needs financial assistance paperwork. .   Shaana Acocella W Kirtis Challis, FNP-BC [facility] [place], [place] [phone]   04/11/2024,  10:24 PM

## 2024-04-12 LAB — CMP14+EGFR
ALT: 15 IU/L (ref 0–44)
AST: 21 IU/L (ref 0–40)
Albumin: 4.7 g/dL (ref 3.8–4.8)
Alkaline Phosphatase: 78 IU/L (ref 44–121)
BUN/Creatinine Ratio: 17 (ref 10–24)
BUN: 27 mg/dL (ref 8–27)
Bilirubin Total: 0.3 mg/dL (ref 0.0–1.2)
CO2: 17 mmol/L — ABNORMAL LOW (ref 20–29)
Calcium: 9.9 mg/dL (ref 8.6–10.2)
Chloride: 103 mmol/L (ref 96–106)
Creatinine, Ser: 1.61 mg/dL — ABNORMAL HIGH (ref 0.76–1.27)
Globulin, Total: 2.7 g/dL (ref 1.5–4.5)
Glucose: 91 mg/dL (ref 70–99)
Potassium: 5 mmol/L (ref 3.5–5.2)
Sodium: 139 mmol/L (ref 134–144)
Total Protein: 7.4 g/dL (ref 6.0–8.5)
eGFR: 45 mL/min/1.73 — ABNORMAL LOW (ref >59)

## 2024-04-12 LAB — THYROID PANEL WITH TSH
Free Thyroxine Index: 0.8 — ABNORMAL LOW (ref 1.2–4.9)
T3 Uptake Ratio: 24 % (ref 24–39)
T4, Total: 3.2 ug/dL — ABNORMAL LOW (ref 4.5–12.0)
TSH: 152 u[IU]/mL — ABNORMAL HIGH (ref 0.450–4.500)

## 2024-04-14 LAB — FECAL OCCULT BLOOD, IMMUNOCHEMICAL: Fecal Occult Bld: NEGATIVE

## 2024-04-16 ENCOUNTER — Ambulatory Visit: Payer: Self-pay | Admitting: Nurse Practitioner

## 2024-04-16 DIAGNOSIS — E039 Hypothyroidism, unspecified: Secondary | ICD-10-CM

## 2024-05-11 ENCOUNTER — Ambulatory Visit: Payer: Self-pay | Admitting: Pharmacist

## 2024-06-01 ENCOUNTER — Telehealth: Payer: Self-pay | Admitting: Nurse Practitioner

## 2024-06-01 NOTE — Telephone Encounter (Signed)
 Called patient to confirm upcoming appointment 06/05/2024. Patient appointment has been successfully confirmed

## 2024-06-05 ENCOUNTER — Ambulatory Visit: Payer: Self-pay | Attending: Family Medicine | Admitting: Pharmacist

## 2024-06-05 ENCOUNTER — Encounter: Payer: Self-pay | Admitting: Pharmacist

## 2024-06-05 VITALS — BP 130/83 | HR 67

## 2024-06-05 DIAGNOSIS — I10 Essential (primary) hypertension: Secondary | ICD-10-CM

## 2024-06-05 NOTE — Progress Notes (Signed)
   S:     No chief complaint on file.  73 y.o. male who presents for hypertension evaluation, education, and management.   Patient was referred and last seen by Primary Care Provider, Thomas Beard, on 04/11/2024. At that visit, BP was 163/93 mmHg.   PMH is significant for HTN, hypothyroidism, BPH w/ hx of ARF in the setting or bladder outlet obstruction. (Most recent Scr stable at 1.61 on 04/11/2024).   Today, patient arrives in good spirits and presents without assistance.  Denies dizziness, headache, blurred vision, swelling.   Patient reports hypertension is longstanding. No known hx of clinical ASCVD or CHF.  Family/Social history:  Fhx: no known pertinent positives Tobacco: never smoker  Alcohol: occasional use   Medication adherence reported. Patient reports taking his morning medications this morning. Takes atenolol  at bedtime.   Current antihypertensives include: atenolol  50 mg daily  Antihypertensives tried in the past include: none  Reported home blood pressure readings: 120s-130s/80s  Patient reported dietary habits:  -Compliant with sodium restriction  -No excessive caffeine   Patient-reported exercise habits:  -None  O:  Vitals:   06/05/24 1557  BP: 130/83  Pulse: 67   Last 3 Office BP readings: BP Readings from Last 3 Encounters:  06/05/24 130/83  04/11/24 (!) 163/93  06/28/23 (!) 150/91   BMET    Component Value Date/Time   NA 139 04/11/2024 1636   K 5.0 04/11/2024 1636   CL 103 04/11/2024 1636   CO2 17 (L) 04/11/2024 1636   GLUCOSE 91 04/11/2024 1636   GLUCOSE 150 (H) 10/08/2022 1325   BUN 27 04/11/2024 1636   CREATININE 1.61 (H) 04/11/2024 1636   CREATININE 0.72 06/26/2013 0905   CALCIUM  9.9 04/11/2024 1636   GFRNONAA 26 (L) 10/08/2022 1222   GFRAA >90 05/24/2014 1355    Renal function: CrCl cannot be calculated (Patient's most recent lab result is older than the maximum 21 days allowed.).  Clinical ASCVD: No  The 10-year ASCVD risk score  (Arnett DK, et al., 2019) is: 27.5%   Values used to calculate the score:     Age: 4 years     Clincally relevant sex: Male     Is Non-Hispanic African American: No     Diabetic: No     Tobacco smoker: No     Systolic Blood Pressure: 130 mmHg     Is BP treated: Yes     HDL Cholesterol: 51 mg/dL     Total Cholesterol: 242 mg/dL  Patient is participating in a Managed Medicaid Plan: No   A/P: Hypertension diagnosed currently close to goal on current medications. BP goal < 130/80 mmHg. Medication adherence appears to be appropriate. -Continued  current regimen. -Counseled on lifestyle modifications for blood pressure control including reduced dietary sodium, increased exercise, adequate sleep. -Encouraged patient to check BP at home and bring log of readings to next visit. Counseled on proper use of home BP cuff.   Results reviewed and written information provided.    Written patient instructions provided. Patient verbalized understanding of treatment plan.  Total time in face to face counseling 30 minutes.    Follow-up:  Pharmacist prn. PCP clinic visit in December.   Herlene Fleeta Morris, PharmD, JAQUELINE, CPP Clinical Pharmacist [facility] [phone]

## 2024-10-02 ENCOUNTER — Ambulatory Visit: Payer: Self-pay | Admitting: Pharmacist

## 2024-11-08 NOTE — Progress Notes (Unsigned)
" ° °  S:     No chief complaint on file.  74 y.o. male who presents for hypertension evaluation, education, and management.   Patient was referred and last seen by Primary Care Provider, [name], on 04/11/2024. At that visit, BP was 163/93 mmHg.   PMH is significant for HTN, hypothyroidism, BPH w/ hx of ARF in the setting or bladder outlet obstruction. (Most recent Scr stable at 1.61 on 04/11/2024).   At today's visit, patient arrives in good spirits and presents without assistance.  Denies dizziness, headache, blurred vision, swelling. *** BP readings at home: ***   Patient reports hypertension is longstanding. No known hx of clinical ASCVD or CHF.  Family/Social history:  Fhx: no known pertinent positives Tobacco: never smoker  Alcohol: occasional use   Medication adherence reported. Patient reports taking his morning medications this morning. Takes atenolol  at bedtime.   Current antihypertensives include:  Atenolol  50 mg daily  Antihypertensives tried in the past include: none  Reported home blood pressure readings: ***  Patient reported dietary habits:  Compliant with sodium restriction  No excessive caffeine   Patient-reported exercise habits:  None  O:  There were no vitals filed for this visit.  Last 3 Office BP readings: BP Readings from Last 3 Encounters:  06/05/24 130/83  04/11/24 (!) 163/93  06/28/23 (!) 150/91   BMET    Component Value Date/Time   NA 139 04/11/2024 1636   K 5.0 04/11/2024 1636   CL 103 04/11/2024 1636   CO2 17 (L) 04/11/2024 1636   GLUCOSE 91 04/11/2024 1636   GLUCOSE 150 (H) 10/08/2022 1325   BUN 27 04/11/2024 1636   CREATININE 1.61 (H) 04/11/2024 1636   CREATININE 0.72 06/26/2013 0905   CALCIUM  9.9 04/11/2024 1636   GFRNONAA 26 (L) 10/08/2022 1222   GFRAA >90 05/24/2014 1355   Renal function: CrCl cannot be calculated (Patient's most recent lab result is older than the maximum 21 days allowed.).  Clinical ASCVD: No  The 10-year  ASCVD risk score (Arnett DK, et al., 2019) is: 27.5%   Values used to calculate the score:     Age: 55 years     Clinically relevant sex: Male     Is Non-Hispanic African American: No     Diabetic: No     Tobacco smoker: No     Systolic Blood Pressure: 130 mmHg     Is BP treated: Yes     HDL Cholesterol: 51 mg/dL     Total Cholesterol: 242 mg/dL  Patient is participating in a Managed Medicaid Plan: No   A/P: Hypertension diagnosed currently close to goal on current medications. BP goal < 130/80 mmHg. Medication adherence appears to be appropriate. Continued  current regimen. Counseled on lifestyle modifications for blood pressure control including reduced dietary sodium, increased exercise, adequate sleep. Encouraged patient to check BP at home and bring log of readings to next visit. Counseled on proper use of home BP cuff.   Results reviewed and written information provided.    Written patient instructions provided. Patient verbalized understanding of treatment plan. Total time in face to face counseling 30 minutes.    Follow-up:  Pharmacist ***. PCP clinic visit in ***.   ***  Herlene Fleeta Morris, PharmD, BCACP, CPP Clinical Pharmacist [facility] [phone]   "

## 2024-11-15 ENCOUNTER — Ambulatory Visit: Payer: Self-pay | Admitting: Pharmacist
